# Patient Record
Sex: Female | Born: 1955 | Race: White | Hispanic: No | Marital: Married | Smoking: Never smoker
Health system: Southern US, Community
[De-identification: ages and names within clinical notes are randomized; demographics above are authoritative.]

## PROBLEM LIST (undated history)

## (undated) DIAGNOSIS — N3010 Interstitial cystitis (chronic) without hematuria: Secondary | ICD-10-CM

## (undated) DIAGNOSIS — E039 Hypothyroidism, unspecified: Secondary | ICD-10-CM

## (undated) DIAGNOSIS — K5909 Other constipation: Secondary | ICD-10-CM

## (undated) DIAGNOSIS — K635 Polyp of colon: Secondary | ICD-10-CM

## (undated) HISTORY — DX: Interstitial cystitis (chronic) without hematuria: N30.10

## (undated) HISTORY — DX: Other constipation: K59.09

## (undated) HISTORY — PX: LASIK: SHX215

## (undated) HISTORY — PX: BREAST SURGERY: SHX581

## (undated) HISTORY — DX: Polyp of colon: K63.5

## (undated) HISTORY — DX: Hypothyroidism, unspecified: E03.9

## (undated) HISTORY — PX: BREAST CYST EXCISION: SHX579

## (undated) HISTORY — PX: EYE SURGERY: SHX253

## (undated) HISTORY — PX: BLADDER INSTILLATION: SHX6893

---

## 1984-11-21 HISTORY — PX: INGUINAL HERNIA REPAIR: SHX194

## 2011-11-22 HISTORY — PX: CATARACT EXTRACTION: SUR2

## 2017-11-24 LAB — HM COLONOSCOPY

## 2019-01-18 LAB — HM MAMMOGRAPHY

## 2019-08-07 LAB — HEPATIC FUNCTION PANEL
ALT: 18 (ref 7–35)
AST: 18 (ref 13–35)
Alkaline Phosphatase: 42 (ref 25–125)
Bilirubin, Total: 0.5

## 2019-08-07 LAB — COMPREHENSIVE METABOLIC PANEL
Albumin: 4.6 (ref 3.5–5.0)
Calcium: 9.6 (ref 8.7–10.7)
GFR calc Af Amer: >60
GFR calc non Af Amer: >60

## 2019-08-07 LAB — LIPID PANEL
Cholesterol: 176 (ref 0–200)
HDL: 80 — AB (ref 35–70)
LDL Cholesterol: 82
Triglycerides: 68 (ref 40–160)

## 2019-08-07 LAB — CBC: RBC: 4.62 (ref 3.87–5.11)

## 2019-08-07 LAB — BASIC METABOLIC PANEL
BUN: 11 (ref 4–21)
CO2: 31 — AB (ref 13–22)
Chloride: 104 (ref 99–108)
Creatinine: 0.9 (ref <=1.1)
Glucose: 83
Potassium: 4.2 (ref 3.4–5.3)
Sodium: 143 (ref 137–147)

## 2019-08-07 LAB — CBC AND DIFFERENTIAL
HCT: 43 (ref 36–46)
Hemoglobin: 13.5 (ref 12.0–16.0)
Platelets: 221 (ref 150–399)
WBC: 6.4

## 2019-08-07 LAB — TSH: TSH: 0.48 (ref <=5.90)

## 2019-09-18 ENCOUNTER — Other Ambulatory Visit: Payer: Self-pay | Admitting: Obstetrics and Gynecology

## 2019-09-18 DIAGNOSIS — Z1231 Encounter for screening mammogram for malignant neoplasm of breast: Secondary | ICD-10-CM

## 2019-09-19 ENCOUNTER — Other Ambulatory Visit: Payer: Self-pay | Admitting: Obstetrics and Gynecology

## 2019-09-23 ENCOUNTER — Other Ambulatory Visit: Payer: Self-pay | Admitting: Obstetrics and Gynecology

## 2019-09-23 DIAGNOSIS — E2839 Other primary ovarian failure: Secondary | ICD-10-CM

## 2019-12-10 ENCOUNTER — Ambulatory Visit
Admission: RE | Admit: 2019-12-10 | Discharge: 2019-12-10 | Disposition: A | Discharge status: Home / Self Care | Payer: PRIVATE HEALTH INSURANCE | Source: Ambulatory Visit | Attending: Obstetrics and Gynecology | Admitting: Obstetrics and Gynecology

## 2019-12-10 ENCOUNTER — Other Ambulatory Visit: Payer: Self-pay

## 2019-12-10 DIAGNOSIS — E2839 Other primary ovarian failure: Secondary | ICD-10-CM

## 2019-12-25 DIAGNOSIS — M21619 Bunion of unspecified foot: Secondary | ICD-10-CM | POA: Insufficient documentation

## 2020-01-20 ENCOUNTER — Ambulatory Visit: Payer: Self-pay

## 2020-01-27 ENCOUNTER — Other Ambulatory Visit: Payer: Self-pay

## 2020-01-29 ENCOUNTER — Ambulatory Visit (INDEPENDENT_AMBULATORY_CARE_PROVIDER_SITE_OTHER): Payer: BC Managed Care – PPO | Admitting: Internal Medicine

## 2020-01-29 ENCOUNTER — Encounter: Payer: Self-pay | Admitting: Internal Medicine

## 2020-01-29 ENCOUNTER — Other Ambulatory Visit: Payer: Self-pay

## 2020-01-29 VITALS — BP 120/70 | HR 89 | Ht 64.76 in | Wt 112.0 lb

## 2020-01-29 DIAGNOSIS — E673 Hypervitaminosis D: Secondary | ICD-10-CM

## 2020-01-29 DIAGNOSIS — M810 Age-related osteoporosis without current pathological fracture: Secondary | ICD-10-CM

## 2020-01-29 LAB — VITAMIN D 25 HYDROXY (VIT D DEFICIENCY, FRACTURES): VITD: 104.7 ng/mL (ref 30.00–100.00)

## 2020-01-29 LAB — BASIC METABOLIC PANEL
BUN: 17 mg/dL (ref 6–23)
CO2: 30 mEq/L (ref 19–32)
Calcium: 9.8 mg/dL (ref 8.4–10.5)
Chloride: 104 mEq/L (ref 96–112)
Creatinine, Ser: 0.85 mg/dL (ref 0.40–1.20)
GFR: 67.87 mL/min (ref >60.00)
Glucose, Bld: 86 mg/dL (ref 70–99)
Potassium: 3.8 mEq/L (ref 3.5–5.1)
Sodium: 140 mEq/L (ref 135–145)

## 2020-01-29 NOTE — Patient Instructions (Addendum)
Please stop at the lab.  Please let me know in 11/2020 to order a new bone density scan.  Please come back for a follow-up appointment in 12/2020.  How Can I Prevent Falls? Men and women with osteoporosis need to take care not to fall down. Falls can break bones. Some reasons people fall are: Poor vision  Poor balance  Certain diseases that affect how you walk  Some types of medicine, such as sleeping pills.  Some tips to help prevent falls outdoors are: Use a cane or walker  Wear rubber-soled shoes so you don't slip  Walk on grass when sidewalks are slippery  In winter, put salt or kitty litter on icy sidewalks.  Some ways to help prevent falls indoors are: Keep rooms free of clutter, especially on floors  Use plastic or carpet runners on slippery floors  Wear low-heeled shoes that provide good support  Do not walk in socks, stockings, or slippers  Be sure carpets and area rugs have skid-proof backs or are tacked to the floor  Be sure stairs are well lit and have rails on both sides  Put grab bars on bathroom walls near tub, shower, and toilet  Use a rubber bath mat in the shower or tub  Keep a flashlight next to your bed  Use a sturdy step stool with a handrail and wide steps  Add more lights in rooms (and night lights) Buy a cordless phone to keep with you so that you don't have to rush to the phone       when it rings and so that you can call for help if you fall.   (adapted from http://www.niams.NightlifePreviews.se)   Exercise for Strong Bones (from [employer]) There are two types of exercises that are important for building and maintaining bone density:  weight-bearing and muscle-strengthening exercises. Weight-bearing Exercises These exercises include activities that make you move against gravity while staying upright. Weight-bearing exercises can be high-impact or low-impact. High-impact weight-bearing exercises  help build bones and keep them strong. If you have broken a bone due to osteoporosis or are at risk of breaking a bone, you may need to avoid high-impact exercises. If you're not sure, you should check with your healthcare provider. Examples of high-impact weight-bearing exercises are: . Dancing . Doing high-impact aerobics . Hiking . Jogging/running . Jumping Rope . Stair climbing . Tennis Low-impact weight-bearing exercises can also help keep bones strong and are a safe alternative if you cannot do high-impact exercises. Examples of low-impact weight-bearing exercises are: . Using elliptical training machines . Doing low-impact aerobics . Using stair-step machines . Fast walking on a treadmill or outside Muscle-Strengthening Exercises These exercises include activities where you move your body, a weight or some other resistance against gravity. They are also known as resistance exercises and include: . Lifting weights . Using elastic exercise bands . Using weight machines . Lifting your own body weight . Functional movements, such as standing and rising up on your toes Yoga and Pilates can also improve strength, balance and flexibility. However, certain positions may not be safe for people with osteoporosis or those at increased risk of broken bones. For example, exercises that have you bend forward may increase the chance of breaking a bone in the spine. A physical therapist should be able to help you learn which exercises are safe and appropriate for you. Non-Impact Exercises Non-impact exercises can help you to improve balance, posture and how well you move in everyday activities. These  exercises can also help to increase muscle strength and decrease the risk of falls and broken bones. Some of these exercises include: . Balance exercises that strengthen your legs and test your balance, such as Tai Chi, can decrease your risk of falls. . Posture exercises that improve your posture and  reduce rounded or "sloping" shoulders can help you decrease the chance of breaking a bone, especially in the spine. . Functional exercises that improve how well you move can help you with everyday activities and decrease your chance of falling and breaking a bone. For example, if you have trouble getting up from a chair or climbing stairs, you should do these activities as exercises. A physical therapist can teach you balance, posture and functional exercises. Starting a New Exercise Program If you haven't exercised regularly for a while, check with your healthcare provider before beginning a new exercise program--particularly if you have health problems such as heart disease, diabetes or high blood pressure. If you're at high risk of breaking a bone, you should work with a physical therapist to develop a safe exercise program. Once you have your healthcare provider's approval, start slowly. If you've already broken bones in the spine because of osteoporosis, be very careful to avoid activities that require reaching down, bending forward, rapid twisting motions, heavy lifting and those that increase your chance of a fall. As you get started, your muscles may feel sore for a day or two after you exercise. If soreness lasts longer, you may be working too hard and need to ease up. Exercises should be done in a pain-free range of motion. How Much Exercise Do You Need? Weight-bearing exercises 30 minutes on most days of the week. Do a 30-minutesession or multiple sessions spread out throughout the day. The benefits to your bones are the same.   Muscle-strengthening exercises Two to three days per week. If you don't have much time for strengthening/resistance training, do small amounts at a time. You can do just one body part each day. For example do arms one day, legs the next and trunk the next. You can also spread these exercises out during your normal day.  Balance, posture and functional exercises Every day  or as often as needed. You may want to focus on one area more than the others. If you have fallen or lose your balance, spend time doing balance exercises. If you are getting rounded shoulders, work more on posture exercises. If you have trouble climbing stairs or getting up from the couch, do more functional exercises. You can also perform these exercises at one time or spread them during your day. Work with a phyiscal therapist to learn the right exercises for you.

## 2020-01-29 NOTE — Progress Notes (Signed)
Patient ID: Sophia [name], female   DOB: [birth year], 64 y.o.   MRN: [id number]   This visit occurred during the SARS-CoV-2 public health emergency.  Safety protocols were in place, including screening questions prior to the visit, additional usage of staff PPE, and extensive cleaning of exam room while observing appropriate contact time as indicated for disinfecting solutions.   HPI  Sophia [name] is a 64 y.o.-year-old female, referred by Dr. Marvel Plan, for management of osteoporosis (OP). She lived in [place] and then in [place] in 07/2019.  She will establish care with [facility] in 02/2020.  Pt was dx with OP in 11/2016.  I reviewed pt's lates DXA scans (per records in Epic and also records brought by patient): Date L1-L3 (L4) T score FN T score 33% distal Radius  12/10/2019 (Breast center) -2.5 RFN: -1.6 LFN: -1.5 n/a  10/10/2018 -2.2 RFN: -1.5 LFN: -1.7 n/a  10/09/2017 -2.6 RFN: -1.5 LFN:-1.8 n/a   She denies fractures. She did have 3 falls since moving to [place] (tripping), but she attributes this to a particular pair of shoes.  No dizziness/vertigo/orthostasis/poor vision.   Previous OP treatments:  - Actonel - for 5 years, until 2018 - Reclast 10/2017, 10/2018  No h/o vitamin D deficiency. No recent vit D levels: 07/2019: vitamin D 89 No results found for: VD25OH  Pt is on: - vitamin D 2000 IU + MVI - vitamin K2  She drinks almond milk + cheese + yoghurt.  She does weight bearing exercises. She also runs 2 mi a day and walks 3 more.  She does not take high vitamin A doses.  Menopause was at 64 y/o.  No HRT.  FH of osteoporosis: mother.  No h/o hyper/hypocalcemia or hyperparathyroidism and is on LT4 50 mcg daily - since age 51. No h/o kidney stones. 07/2019: Ca 9.6  No results found for: PTH, CALCIUM   No h/o thyrotoxicosis.  She has a history of hypothyroidism.  Reviewed TSH recent levels:  07/2019: TSH 0.48 No results found for: TSH   No  h/o CKD. Last BUN/Cr: 07/2019: BUN/Cr: 11/0.88, GFR >60 No results found for: BUN, CREATININE   Diet: - Coffee with creamer - Breakfast: Oatmeal with almonds, flax/Chia seeds or coconut yogurt - Lunch: Skips - Midafternoon snack: Nuts, cheese, popcorn - Dinner: Patient/poultry with vegetables/greens, smoothie with almond milk  She takes several supplements including: Immunity support, Zicam chewable, turmeric, biotin 10,000 mcg daily, methyl B12 and folate, probiotic, vitamin K2 + D3 2400-3400 units, niacinamide, magnesium, vitamin C.  ROS: Constitutional: no weight gain, no weight loss, no fatigue, no subjective hyperthermia, no subjective hypothermia, + increased urination and nocturia Eyes: no blurry vision, no xerophthalmia ENT: no sore throat, no nodules palpated in neck, no dysphagia, no odynophagia, no hoarseness, no tinnitus, no hypoacusis Cardiovascular: no CP, no SOB, no palpitations, no leg swelling Respiratory: no cough, no SOB, no wheezing Gastrointestinal: no N, no V, no D, no C, no acid reflux Musculoskeletal: no muscle, no joint aches Skin: no rash, no hair loss Neurological: no tremors, no numbness or tingling/no dizziness/no HAs Psychiatric: no depression, no anxiety  I reviewed pt's medications, allergies, PMH, social hx, family hx, and changes were documented in the history of present illness. Otherwise, unchanged from my initial visit note.  PMH: -OP -Interstitial cystitis  PSxH: -Bladder injection-Botox 2007, 2012, 2018 -Cataract surgery 2013 -Vitrectomy 2012 -Laser-assisted in situ keratomileusis 2000 -Excision of cyst in the right breast-1985, left breast-1999 -Repair of inguinal hernia 1996  Social History   Socioeconomic History  . Marital status: Married    Spouse name: Not on file  . Number of children: 3  . Years of education: Not on file  . Highest education level: Not on file  Occupational History  .  Homemaker/community volunteer   Tobacco Use  . Smoking status: Not on file  Substance and Sexual Activity  . Alcohol use:  1 glass of wine once a week   Social Determinants of Health   Financial Resource Strain:   . Difficulty of Paying Living Expenses: Not on file  Food Insecurity:   . Worried About Charity fundraiser in the Last Year: Not on file  . Ran Out of Food in the Last Year: Not on file  Transportation Needs:   . Lack of Transportation (Medical): Not on file  . Lack of Transportation (Non-Medical): Not on file  Physical Activity:   . Days of Exercise per Week: Not on file  . Minutes of Exercise per Session: Not on file  Stress:   . Feeling of Stress : Not on file  Social Connections:   . Frequency of Communication with Friends and Family: Not on file  . Frequency of Social Gatherings with Friends and Family: Not on file  . Attends Religious Services: Not on file  . Active Member of Clubs or Organizations: Not on file  . Attends Archivist Meetings: Not on file  . Marital Status: Not on file  Intimate Partner Violence:   . Fear of Current or Ex-Partner: Not on file  . Emotionally Abused: Not on file  . Physically Abused: Not on file  . Sexually Abused: Not on file   Current Outpatient Medications  Medication Sig Dispense Refill  . levothyroxine (SYNTHROID) 50 MCG tablet 50 mcg.     No current facility-administered medications for this visit.  + Supplements: See HPI  Allergies  Allergen Reactions  . Sulfa Antibiotics Hives   FH: - Sister, M and F with HTN - MGM with uterine CA  PE: BP 120/70   Pulse 89   Ht 5' 4.76" (1.645 m) Comment: measured today without shoes  Wt 112 lb (50.8 kg)   SpO2 96%   BMI 18.77 kg/m  Wt Readings from Last 3 Encounters:  01/29/20 112 lb (50.8 kg)   Constitutional: Normal weight, in NAD. No kyphosis. Eyes: PERRLA, EOMI, no exophthalmos ENT: moist mucous membranes, no thyromegaly, no cervical lymphadenopathy Cardiovascular: RRR, No  MRG Respiratory: CTA B Gastrointestinal: abdomen soft, NT, ND, BS+ Musculoskeletal: no deformities, strength intact in all 4 Skin: moist, warm, no rashes Neurological: no tremor with outstretched hands, DTR normal in all 4  Assessment: 1. Osteoporosis  Plan: 1. Osteoporosis - likely postmenopausal/age-related, she has FH of OP - Discussed about increased risk of fracture, depending on the T score, greatly increased when the T score is lower than -2.5, but it is actually a continuum and -2.5 should not be regarded as an absolute threshold. We reviewed her last 3 DXA scan reports together, and I explained that based on the T scores, she has an increased risk for fractures.  However, the last report was obtained in [place], on a different DXA machine and it is not directly comparable to the previous 2 ones.  However, the T scores were stable at the level of the femoral necks and slightly lower, in the osteoporotic range at the spine.  On the last report, L4 vertebra had to be excluded from analysis  due to degenerative changes.  This may have been read the results.  For the above reasons, I would be reticent to draw conclusions about the change in BMD from 2019 to 2021. -She has been on a po bisphosphonate between 2013 and 2018 and then she had 2 years of Reclast.  We discussed about the different medication classes, benefits and side effects (including atypical fractures and ONJ - no dental workup in progress or planned).  For now, I suggested to continue without Reclast for another year until we get a new bone density scan and then decide whether we need to start back on medication or not.  At that time, if we do need to start medication, I suggested to consider sq denosumab (Prolia) sq every 6 months for 6-10 years or zoledronic acid (Reclast) iv 1x a year for 1-2 years. I would use Teriparatide/Abaloparatide (which are daily subcu medication) or Romosozumab (monthly) as a last resort.  - we  reviewed her dietary and supplemental calcium and vitamin D intake.  She appears to get 1000-1200 mg of calcium daily from diet and I will check vit D today to see if she needs further supplementation.  She is getting more than 2000 units and supplements. - discussed fall precautions   - given handout from [facility]: weight bearing exercises - advised to do this every day or at least 5/7 days - I also recommended the [facility] - for skeletal loading. Given brochure and explained benefits. - we discussed about maintaining a good amount of protein in her diet. The recommended daily protein intake is ~0.8 g per kilogram per day. I advised her to try to aim for this amount, since a diet low in proteins can exacerbate osteoporosis. Also, avoid smoking or >2 drinks of alcohol a day. - I recommended an alkaline diet and explained how to render her diet more alkaline - Will check the following labs today: Orders Placed This Encounter  Procedures  . Basic metabolic panel  . VITAMIN D 25 Hydroxy (Vit-D Deficiency, Fractures)  - will check a new DXA scan in 1 year, on the same bone densitometer: unchanged or slightly higher T-scores are desirable - will see pt back in 11 months  - time spent for this appointment: 1 hour - in obtaining information about her osteoporosis, reviewing her previous records both available in the chart and brought by patient including labs, evaluations, and treatments, counseling her about her condition (please see the discussed topics above), and developing a plan to further investigate and treat it; she had a number of questions which I addressed.  Office Visit on 01/29/2020  Component Date Value Ref Range Status  . Sodium 01/29/2020 140  135 - 145 mEq/L Final  . Potassium 01/29/2020 3.8  3.5 - 5.1 mEq/L Final  . Chloride 01/29/2020 104  96 - 112 mEq/L Final  . CO2 01/29/2020 30  19 - 32 mEq/L Final  . Glucose, Bld 01/29/2020 86  70 - 99 mg/dL  Final  . BUN 01/29/2020 17  6 - 23 mg/dL Final  . Creatinine, Ser 01/29/2020 0.85  0.40 - 1.20 mg/dL Final  . GFR 01/29/2020 67.87  >60.00 mL/min Final  . Calcium 01/29/2020 9.8  8.4 - 10.5 mg/dL Final  . VITD 01/29/2020 104.70* 30.00 - 100.00 ng/mL Final   Vitamin D is too high.  I would advise her to stop taking the 2000 units for the next 5 days and then start only 1000 units.  In 1.5 to 2 months, I will have her back for repeat level. Philemon Kingdom, MD PhD [facility] Endocrinology

## 2020-01-30 ENCOUNTER — Encounter: Payer: Self-pay | Admitting: Internal Medicine

## 2020-02-24 ENCOUNTER — Ambulatory Visit: Payer: Self-pay

## 2020-02-27 ENCOUNTER — Other Ambulatory Visit: Payer: Self-pay

## 2020-02-28 ENCOUNTER — Ambulatory Visit (INDEPENDENT_AMBULATORY_CARE_PROVIDER_SITE_OTHER): Payer: BC Managed Care – PPO | Admitting: Family Medicine

## 2020-02-28 ENCOUNTER — Encounter: Payer: Self-pay | Admitting: Family Medicine

## 2020-02-28 VITALS — BP 102/60 | HR 82 | Temp 97.7°F | Ht 64.76 in | Wt 110.9 lb

## 2020-02-28 DIAGNOSIS — M810 Age-related osteoporosis without current pathological fracture: Secondary | ICD-10-CM | POA: Diagnosis not present

## 2020-02-28 DIAGNOSIS — E039 Hypothyroidism, unspecified: Secondary | ICD-10-CM | POA: Diagnosis not present

## 2020-02-28 DIAGNOSIS — N3010 Interstitial cystitis (chronic) without hematuria: Secondary | ICD-10-CM | POA: Diagnosis not present

## 2020-02-28 DIAGNOSIS — C4491 Basal cell carcinoma of skin, unspecified: Secondary | ICD-10-CM

## 2020-02-28 LAB — TSH: TSH: 1.92 u[IU]/mL (ref 0.35–4.50)

## 2020-02-28 MED ORDER — LEVOTHYROXINE SODIUM 50 MCG PO TABS: 50.0000 ug | ORAL_TABLET | Freq: Every day | ORAL | 1 refills | Status: DC

## 2020-02-28 NOTE — Patient Instructions (Signed)
[email or web address] ([email or web address] is her website)

## 2020-02-28 NOTE — Progress Notes (Signed)
Sophia [name] DOB: [birth year] Encounter date: 02/28/2020  This isa 64 y.o. female who presents to establish care. Chief Complaint  Patient presents with  . Establish Care    History of present illness:  Osteoporosis: following with Dr. Renne Crigler; has been on actonel and reclast in past; and is decreasing vitamin D (she was over-supplementing) for now with plans for repeat DEXA in a year.  Sees Dr. Marvel Plan for gyn needs  Left knee occasionally hurts - was walking dog when she initially did it; just turned wrong way. Does run daily. Knee doesn't bother her while running. Did have left hip bursitis which bothered her in past.   Had yearly mammogram in this last month.   Just made it through norovirus. But feeling much better now. Has to take miralax for bowel movements.   Had colonoscopy last in 11/2017; had polyp. Suggested repeat in 5 years; but would like to stick to 3 years.   Has always had small bladder/frequency. After having 3 kids she got tired of symptoms. Has had 79 urologists between all the moves in the last 20 years; and 3 botox injections (which didn't help much except for retention which wasn't always helpful). Doesn't have a lot of pain; more issue with frequency. Interrupts sleep - wakes every 2 hours.   Has completed COVID vaccinations.   [facility] - had a little basal cell and is following yearly with them now.    Past Medical History:  Diagnosis Date  . Hypothyroidism    Past Surgical History:  Procedure Laterality Date  . BLADDER INSTILLATION     2007,2012,2018 botox  . BREAST SURGERY     1985-right, mid 1990s-left benign (fibrocystic breast disease)  . CATARACT EXTRACTION Left 2013  . EYE SURGERY Left    done for floaters  . INGUINAL HERNIA REPAIR Left 1986  . LASIK Bilateral    2000   Allergies  Allergen Reactions  . Sulfa Antibiotics Hives   Current Meds  Medication Sig  . Ascorbic Acid (VITAMIN C PO) Take by mouth as needed.  .  Cholecalciferol (VITAMIN D3 PO) Take 2,000 Units by mouth daily.  . Homeopathic Products [facility] COLD REMEDY NA) by Other route in the morning and at bedtime.  Marland Kitchen levothyroxine (SYNTHROID) 50 MCG tablet Take 1 tablet (50 mcg total) by mouth daily before breakfast.  . MAGNESIUM PO Take 200 mg by mouth daily.  . Menaquinone-7 (VITAMIN K2) 100 MCG CAPS Take by mouth. daily  . NIACINAMIDE PO Take 1,000 mg by mouth daily.  Marland Kitchen OVER THE COUNTER MEDICATION Immunity support 1000mg  daily  . OVER THE COUNTER MEDICATION Turmeric/curcumin 1000mg  daily  . OVER THE COUNTER MEDICATION Methyl B-12 1025mcg and Methyl Folate(plus P-5-P/B-6) 470mcg once a day  . Probiotic Product (PROBIOTIC DAILY PO) Take by mouth.  . [DISCONTINUED] levothyroxine (SYNTHROID) 50 MCG tablet 50 mcg.   Social History   Tobacco Use  . Smoking status: Never Smoker  . Smokeless tobacco: Never Used  Substance Use Topics  . Alcohol use: Yes    Comment: occasionally   Family History  Problem Relation Age of Onset  . Arthritis Mother   . Hypertension Mother   . Hypertension Father   . Prostate cancer Father 22  . Basal cell carcinoma Father   . Hypertension Sister   . Diabetes Mellitus II Maternal Grandmother   . Uterine cancer Maternal Grandmother        ? not certain     Review of Systems  Constitutional: Negative for chills, fatigue and fever.  Respiratory: Negative for cough, chest tightness, shortness of breath and wheezing.   Cardiovascular: Negative for chest pain, palpitations and leg swelling.  Musculoskeletal:       Left trochanteric bursitis flares on and off    Objective:  BP 102/60 (BP Location: Left Arm, Patient Position: Sitting, Cuff Size: Normal)   Pulse 82   Temp 97.7 F (36.5 C) (Temporal)   Ht 5' 4.76" (1.645 m)   Wt 110 lb 14.4 oz (50.3 kg)   BMI 18.59 kg/m   Weight: 110 lb 14.4 oz (50.3 kg)   BP Readings from Last 3 Encounters:  02/28/20 102/60  01/29/20 120/70   Wt Readings from Last 3  Encounters:  02/28/20 110 lb 14.4 oz (50.3 kg)  01/29/20 112 lb (50.8 kg)    Physical Exam Constitutional:      General: She is not in acute distress.    Appearance: She is well-developed.  Cardiovascular:     Rate and Rhythm: Normal rate and regular rhythm.     Heart sounds: Normal heart sounds. No murmur. No friction rub.  Pulmonary:     Effort: Pulmonary effort is normal. No respiratory distress.     Breath sounds: Normal breath sounds. No wheezing or rales.  Musculoskeletal:     Right lower leg: No edema.     Left lower leg: No edema.  Neurological:     Mental Status: She is alert and oriented to person, place, and time.  Psychiatric:        Behavior: Behavior normal.     Assessment/Plan:  1. Hypothyroidism, unspecified type Will recheck today; we discussed making sure not to overcorrect thyroid due to affects on bone density. She has been well controlled on current dose of synthroid. - TSH; Future - TSH  2. Osteoporosis without current pathological fracture, unspecified osteoporosis type Following with endo for this. Plan to repeat dexa next year.  3. Interstitial cystitis Symptoms are stable. Has seen multiple urologists in past and had extensive eval.  4. Basal cell carcinoma (BCC), unspecified site Following w skin surgery center.  Return in about 6 months (around 08/29/2020) for physical exam.  Micheline Rough, MD

## 2020-03-01 DIAGNOSIS — N3010 Interstitial cystitis (chronic) without hematuria: Secondary | ICD-10-CM | POA: Insufficient documentation

## 2020-03-01 DIAGNOSIS — C4491 Basal cell carcinoma of skin, unspecified: Secondary | ICD-10-CM | POA: Insufficient documentation

## 2020-03-01 DIAGNOSIS — M810 Age-related osteoporosis without current pathological fracture: Secondary | ICD-10-CM | POA: Insufficient documentation

## 2020-03-02 ENCOUNTER — Other Ambulatory Visit: Payer: Self-pay

## 2020-03-02 ENCOUNTER — Ambulatory Visit
Admission: RE | Admit: 2020-03-02 | Discharge: 2020-03-02 | Disposition: A | Discharge status: Home / Self Care | Payer: BC Managed Care – PPO | Source: Ambulatory Visit | Attending: Obstetrics and Gynecology | Admitting: Obstetrics and Gynecology

## 2020-03-02 DIAGNOSIS — Z1231 Encounter for screening mammogram for malignant neoplasm of breast: Secondary | ICD-10-CM

## 2020-03-03 ENCOUNTER — Encounter: Payer: Self-pay | Admitting: Family Medicine

## 2020-03-03 NOTE — Addendum Note (Signed)
Addended by: Agnes Lawrence on: 03/03/2020 02:10 PM   Modules accepted: Orders

## 2020-03-05 ENCOUNTER — Encounter: Payer: Self-pay | Admitting: Family Medicine

## 2020-03-30 ENCOUNTER — Encounter: Payer: Self-pay | Admitting: Internal Medicine

## 2020-03-30 ENCOUNTER — Other Ambulatory Visit: Payer: Self-pay | Admitting: Internal Medicine

## 2020-03-30 ENCOUNTER — Other Ambulatory Visit: Payer: Self-pay

## 2020-03-30 ENCOUNTER — Other Ambulatory Visit (INDEPENDENT_AMBULATORY_CARE_PROVIDER_SITE_OTHER): Payer: BC Managed Care – PPO

## 2020-03-30 DIAGNOSIS — E673 Hypervitaminosis D: Secondary | ICD-10-CM | POA: Diagnosis not present

## 2020-03-30 LAB — VITAMIN D 25 HYDROXY (VIT D DEFICIENCY, FRACTURES): VITD: 100.64 ng/mL — ABNORMAL HIGH (ref 30.00–100.00)

## 2020-03-31 ENCOUNTER — Other Ambulatory Visit: Payer: BC Managed Care – PPO

## 2020-03-31 DIAGNOSIS — E673 Hypervitaminosis D: Secondary | ICD-10-CM

## 2020-04-01 ENCOUNTER — Other Ambulatory Visit: Payer: BC Managed Care – PPO

## 2020-04-01 ENCOUNTER — Other Ambulatory Visit: Payer: Self-pay

## 2020-04-01 LAB — VITAMIN D 25 HYDROXY (VIT D DEFICIENCY, FRACTURES): Vit D, 25-Hydroxy: 80.9 ng/mL (ref 30.0–100.0)

## 2020-04-16 ENCOUNTER — Encounter: Payer: Self-pay | Admitting: Internal Medicine

## 2020-05-22 NOTE — Telephone Encounter (Signed)
Patient called to advise that she is still receiving a bill for lab work and does not feel that it is accurate.  Date of service in question is 03/31/2020.  She has called [facility] billing and the routed her back to office.  Patient has contacted Lab and Insurance with no resolution.  Call back number [phone] - [facility]

## 2020-06-03 ENCOUNTER — Other Ambulatory Visit: Payer: Self-pay

## 2020-06-03 ENCOUNTER — Other Ambulatory Visit: Payer: BC Managed Care – PPO

## 2020-06-03 DIAGNOSIS — E039 Hypothyroidism, unspecified: Secondary | ICD-10-CM

## 2020-06-03 NOTE — Addendum Note (Signed)
Addended by: Marrion Coy on: 06/03/2020 10:48 AM   Modules accepted: Orders

## 2020-06-04 ENCOUNTER — Encounter: Payer: Self-pay | Admitting: Family Medicine

## 2020-06-04 LAB — TSH: TSH: 2.39 mIU/L (ref 0.40–4.50)

## 2020-07-06 ENCOUNTER — Encounter: Payer: Self-pay | Admitting: Family Medicine

## 2020-07-06 MED ORDER — LEVOTHYROXINE SODIUM 25 MCG PO TABS: 25.0000 ug | ORAL_TABLET | Freq: Every day | ORAL | 0 refills | Status: DC

## 2020-07-06 NOTE — Telephone Encounter (Signed)
Rx for Levothyroxine 3mcg sent to [facility] per results notes from 4/11 and pt informed via Mychart message.

## 2020-07-18 ENCOUNTER — Encounter: Payer: Self-pay | Admitting: Family Medicine

## 2020-08-06 ENCOUNTER — Encounter: Payer: Self-pay | Admitting: Internal Medicine

## 2020-08-31 ENCOUNTER — Encounter: Payer: Self-pay | Admitting: Family Medicine

## 2020-08-31 ENCOUNTER — Ambulatory Visit (INDEPENDENT_AMBULATORY_CARE_PROVIDER_SITE_OTHER): Payer: BC Managed Care – PPO | Admitting: Family Medicine

## 2020-08-31 ENCOUNTER — Other Ambulatory Visit: Payer: Self-pay

## 2020-08-31 VITALS — BP 96/58 | HR 80 | Temp 98.3°F | Ht 65.75 in | Wt 112.8 lb

## 2020-08-31 DIAGNOSIS — Z13220 Encounter for screening for lipoid disorders: Secondary | ICD-10-CM

## 2020-08-31 DIAGNOSIS — M810 Age-related osteoporosis without current pathological fracture: Secondary | ICD-10-CM | POA: Diagnosis not present

## 2020-08-31 DIAGNOSIS — N3010 Interstitial cystitis (chronic) without hematuria: Secondary | ICD-10-CM | POA: Diagnosis not present

## 2020-08-31 DIAGNOSIS — C4491 Basal cell carcinoma of skin, unspecified: Secondary | ICD-10-CM

## 2020-08-31 DIAGNOSIS — E039 Hypothyroidism, unspecified: Secondary | ICD-10-CM

## 2020-08-31 DIAGNOSIS — Z0000 Encounter for general adult medical examination without abnormal findings: Secondary | ICD-10-CM | POA: Diagnosis not present

## 2020-08-31 DIAGNOSIS — Z131 Encounter for screening for diabetes mellitus: Secondary | ICD-10-CM

## 2020-08-31 DIAGNOSIS — G4700 Insomnia, unspecified: Secondary | ICD-10-CM

## 2020-08-31 DIAGNOSIS — D126 Benign neoplasm of colon, unspecified: Secondary | ICD-10-CM

## 2020-08-31 MED ORDER — ESCITALOPRAM OXALATE 5 MG PO TABS: 5.0000 mg | ORAL_TABLET | Freq: Every day | ORAL | 1 refills | Status: DC

## 2020-08-31 NOTE — Progress Notes (Addendum)
Sophia [name] DOB: 1956-03-01 Encounter date: 08/31/2020  This is a 64 y.o. female who presents for complete physical   History of present illness/Additional concerns: Not sleeping well. Last night woke at 2:30 and couldn't get back to sleep until 5. Gets some evening anxiety and starts thinking about things. Had nightmare on mirtazepine. Didn't do well with the trazodone. Interested in trying lexapro.   Had COVID booster and   Osteoporosis: following with Dr. Renne Crigler; has been on actonel and reclast in past; and is decreasing vitamin D.  Bone density was 12/10/2019.  Sees Dr. Marvel Plan for gyn needs.  Mammogram completed 02/2020: Normal  Had colonoscopy last in 11/2017; had polyp. Suggested repeat in 5 years; but would like to stick to 3 years.   Urinary frequency, chronic: still waking her as well; but not worse. Usually able to fall asleep after urinating.   [facility] surgery center - had a little basal cell and is following yearly or twice yearly with them.   Past Medical History:  Diagnosis Date  . Hypothyroidism    Past Surgical History:  Procedure Laterality Date  . BLADDER INSTILLATION     2007,2012,2018 botox  . BREAST CYST EXCISION Bilateral 1985, 1995   both benign, 1 on each breast  . BREAST SURGERY     1985-right, mid 1990s-left benign (fibrocystic breast disease)  . CATARACT EXTRACTION Left 2013  . EYE SURGERY Left    done for floaters  . INGUINAL HERNIA REPAIR Left 1986  . LASIK Bilateral    2000   Allergies  Allergen Reactions  . Sulfa Antibiotics Hives   Current Meds  Medication Sig  . Ascorbic Acid (VITAMIN C PO) Take by mouth as needed.  . Cholecalciferol (VITAMIN D3 PO) Take 2,000 Units by mouth daily.  . Homeopathic Products [facility] COLD REMEDY NA) by Other route in the morning and at bedtime.  Marland Kitchen levothyroxine (SYNTHROID) 25 MCG tablet Take 1 tablet (25 mcg total) by mouth daily before breakfast.  . MAGNESIUM PO Take 200 mg by mouth daily.  .  Menaquinone-7 (VITAMIN K2) 100 MCG CAPS Take by mouth. daily  . NIACINAMIDE PO Take 1,000 mg by mouth daily.  Marland Kitchen OVER THE COUNTER MEDICATION Immunity support 1000mg  daily  . OVER THE COUNTER MEDICATION Turmeric/curcumin 1000mg  daily  . OVER THE COUNTER MEDICATION Methyl B-12 1017mcg and Methyl Folate(plus P-5-P/B-6) 473mcg once a day  . Probiotic Product (PROBIOTIC DAILY PO) Take by mouth.   Social History   Tobacco Use  . Smoking status: Never Smoker  . Smokeless tobacco: Never Used  Substance Use Topics  . Alcohol use: Yes    Comment: occasionally   Family History  Problem Relation Age of Onset  . Arthritis Mother   . Hypertension Mother   . Hypertension Father   . Prostate cancer Father 39  . Basal cell carcinoma Father   . Hypertension Sister   . Diabetes Mellitus II Maternal Grandmother   . Uterine cancer Maternal Grandmother        ? not certain     Review of Systems  Constitutional: Negative for activity change, appetite change, chills, fatigue, fever and unexpected weight change.  HENT: Negative for congestion, ear pain, hearing loss, sinus pressure, sinus pain, sore throat and trouble swallowing.   Eyes: Negative for pain and visual disturbance.  Respiratory: Negative for cough, chest tightness, shortness of breath and wheezing.   Cardiovascular: Negative for chest pain, palpitations and leg swelling.  Gastrointestinal: Negative for abdominal pain, blood in  stool, constipation, diarrhea, nausea and vomiting.  Genitourinary: Negative for difficulty urinating and menstrual problem.  Musculoskeletal: Negative for arthralgias and back pain.  Skin: Negative for rash.  Neurological: Negative for dizziness, weakness, numbness and headaches.  Hematological: Negative for adenopathy. Does not bruise/bleed easily.  Psychiatric/Behavioral: Negative for sleep disturbance and suicidal ideas. The patient is not nervous/anxious.     CBC:  Lab Results  Component Value Date    WBC 6.4 08/07/2019   HGB 13.5 08/07/2019   HCT 43 08/07/2019   PLT 221 08/07/2019   CMP: Lab Results  Component Value Date   NA 140 01/29/2020   NA 143 08/07/2019   K 3.8 01/29/2020   CL 104 01/29/2020   CO2 30 01/29/2020   GLUCOSE 86 01/29/2020   BUN 17 01/29/2020   BUN 11 08/07/2019   CREATININE 0.85 01/29/2020   GFRAA >60 08/07/2019   CALCIUM 9.8 01/29/2020   ALKPHOS 42 08/07/2019   ALT 18 08/07/2019   AST 18 08/07/2019   LIPID: Lab Results  Component Value Date   CHOL 176 08/07/2019   TRIG 68 08/07/2019   HDL 80 (A) 08/07/2019   LDLCALC 82 08/07/2019    Objective:  BP (!) 96/58 (BP Location: Left Arm, Patient Position: Sitting, Cuff Size: Normal)   Pulse 80   Temp 98.3 F (36.8 C) (Oral)   Ht 5' 5.75" (1.67 m)   Wt 112 lb 12.8 oz (51.2 kg)   BMI 18.35 kg/m   Weight: 112 lb 12.8 oz (51.2 kg)   BP Readings from Last 3 Encounters:  08/31/20 (!) 96/58  02/28/20 102/60  01/29/20 120/70   Wt Readings from Last 3 Encounters:  08/31/20 112 lb 12.8 oz (51.2 kg)  02/28/20 110 lb 14.4 oz (50.3 kg)  01/29/20 112 lb (50.8 kg)    Physical Exam Constitutional:      General: She is not in acute distress.    Appearance: She is well-developed.  HENT:     Head: Normocephalic and atraumatic.     Right Ear: External ear normal.     Left Ear: External ear normal.     Mouth/Throat:     Pharynx: No oropharyngeal exudate.  Eyes:     Conjunctiva/sclera: Conjunctivae normal.     Pupils: Pupils are equal, round, and reactive to light.  Neck:     Thyroid: No thyromegaly.  Cardiovascular:     Rate and Rhythm: Normal rate and regular rhythm.     Heart sounds: Normal heart sounds. No murmur heard.  No friction rub. No gallop.   Pulmonary:     Effort: Pulmonary effort is normal.     Breath sounds: Normal breath sounds.  Abdominal:     General: Bowel sounds are normal. There is no distension.     Palpations: Abdomen is soft. There is no mass.     Tenderness: There is  no abdominal tenderness. There is no guarding.     Hernia: No hernia is present.  Musculoskeletal:        General: No tenderness or deformity. Normal range of motion.     Cervical back: Normal range of motion and neck supple.  Lymphadenopathy:     Cervical: No cervical adenopathy.  Skin:    General: Skin is warm and dry.     Findings: No rash.  Neurological:     Mental Status: She is alert and oriented to person, place, and time.     Deep Tendon Reflexes: Reflexes normal.  Reflex Scores:      Tricep reflexes are 2+ on the right side and 2+ on the left side.      Bicep reflexes are 2+ on the right side and 2+ on the left side.      Brachioradialis reflexes are 2+ on the right side and 2+ on the left side.      Patellar reflexes are 2+ on the right side and 2+ on the left side. Psychiatric:        Speech: Speech normal.        Behavior: Behavior normal.        Thought Content: Thought content normal.     Assessment/Plan: There are no preventive care reminders to display for this patient. Health Maintenance reviewed.  1. Preventative health care Keep up with regular exercise and healthy eating.   2. Osteoporosis without current pathological fracture, unspecified osteoporosis type Following with endo for this. Working on [employer] bearing exercise.  - VITAMIN D 25 Hydroxy (Vit-D Deficiency, Fractures); Future  3. Basal cell carcinoma (BCC), unspecified site Following with skin specialist; always wearing sun protection.   4. Interstitial cystitis - CBC with Differential/Platelet; Future  5. Hypothyroidism, unspecified type Continue with synthroid; will recheck for stability.  - TSH; Future  6. Tubular adenoma of colon - Ambulatory referral to Gastroenterology  7. Lipid screening - Lipid panel; Future  8. Screening for diabetes mellitus - Comprehensive metabolic panel; Future  9. Insomnia: we are going to try lexapro; trying for evening anxiety.   Return in about 6  months (around 03/01/2021) for Chronic condition visit.  Micheline Rough, MD

## 2020-09-01 LAB — CBC WITH DIFFERENTIAL/PLATELET
Absolute Monocytes: 419 cells/uL (ref 200–950)
Basophils Absolute: 48 cells/uL (ref 0–200)
Basophils Relative: 0.9 %
Eosinophils Absolute: 260 cells/uL (ref 15–500)
Eosinophils Relative: 4.9 %
HCT: 41.6 % (ref 35.0–45.0)
Hemoglobin: 13.6 g/dL (ref 11.7–15.5)
Lymphs Abs: 1855 cells/uL (ref 850–3900)
MCH: 30 pg (ref 27.0–33.0)
MCHC: 32.7 g/dL (ref 32.0–36.0)
MCV: 91.6 fL (ref 80.0–100.0)
MPV: 11.9 fL (ref 7.5–12.5)
Monocytes Relative: 7.9 %
Neutro Abs: 2719 cells/uL (ref 1500–7800)
Neutrophils Relative %: 51.3 %
Platelets: 218 10*3/uL (ref 140–400)
RBC: 4.54 10*6/uL (ref 3.80–5.10)
RDW: 12.3 % (ref 11.0–15.0)
Total Lymphocyte: 35 %
WBC: 5.3 10*3/uL (ref 3.8–10.8)

## 2020-09-01 LAB — COMPREHENSIVE METABOLIC PANEL
AG Ratio: 1.7 (calc) (ref 1.0–2.5)
ALT: 19 U/L (ref 6–29)
AST: 19 U/L (ref 10–35)
Albumin: 4.5 g/dL (ref 3.6–5.1)
Alkaline phosphatase (APISO): 51 U/L (ref 37–153)
BUN: 14 mg/dL (ref 7–25)
CO2: 31 mmol/L (ref 20–32)
Calcium: 9.8 mg/dL (ref 8.6–10.4)
Chloride: 104 mmol/L (ref 98–110)
Creat: 0.87 mg/dL (ref 0.50–0.99)
Globulin: 2.6 g/dL (calc) (ref 1.9–3.7)
Glucose, Bld: 92 mg/dL (ref 65–99)
Potassium: 4.1 mmol/L (ref 3.5–5.3)
Sodium: 141 mmol/L (ref 135–146)
Total Bilirubin: 0.4 mg/dL (ref 0.2–1.2)
Total Protein: 7.1 g/dL (ref 6.1–8.1)

## 2020-09-01 LAB — LIPID PANEL
Cholesterol: 177 mg/dL (ref <200)
HDL: 72 mg/dL (ref >=50)
LDL Cholesterol (Calc): 92 mg/dL (calc)
Non-HDL Cholesterol (Calc): 105 mg/dL (calc) (ref <130)
Total CHOL/HDL Ratio: 2.5 (calc) (ref <5.0)
Triglycerides: 52 mg/dL (ref <150)

## 2020-09-01 LAB — VITAMIN D 25 HYDROXY (VIT D DEFICIENCY, FRACTURES): Vit D, 25-Hydroxy: 76 ng/mL (ref 30–100)

## 2020-09-01 LAB — TSH: TSH: 2.91 mIU/L (ref 0.40–4.50)

## 2020-09-07 ENCOUNTER — Encounter: Payer: Self-pay | Admitting: Internal Medicine

## 2020-09-08 ENCOUNTER — Other Ambulatory Visit: Payer: Self-pay | Admitting: Internal Medicine

## 2020-09-08 DIAGNOSIS — M810 Age-related osteoporosis without current pathological fracture: Secondary | ICD-10-CM

## 2020-09-08 MED ORDER — ZOLEDRONIC ACID 5 MG/100ML IV SOLN: 5.0000 mg | Freq: Once | INTRAVENOUS | Status: DC

## 2020-09-09 ENCOUNTER — Other Ambulatory Visit: Payer: Self-pay | Admitting: Obstetrics and Gynecology

## 2020-09-09 DIAGNOSIS — Z1231 Encounter for screening mammogram for malignant neoplasm of breast: Secondary | ICD-10-CM

## 2020-09-10 ENCOUNTER — Other Ambulatory Visit: Payer: Self-pay | Admitting: Internal Medicine

## 2020-09-10 DIAGNOSIS — M810 Age-related osteoporosis without current pathological fracture: Secondary | ICD-10-CM

## 2020-09-15 ENCOUNTER — Telehealth: Payer: Self-pay | Admitting: Gastroenterology

## 2020-09-15 ENCOUNTER — Telehealth: Payer: Self-pay

## 2020-09-15 NOTE — Telephone Encounter (Signed)
Started PA for Reclast- got notified by [employer] that the PA was cancelled as it did not require a PA- will send information to short stay to get scheduled for Reclast.

## 2020-09-15 NOTE — Telephone Encounter (Signed)
Reviewed records from [facility] clinic in [place].  Colonoscopy 11/24/2017 with Dr. Rock Nephew revealed 2 small polyps and small internal hemorrhoids.  Pathology results show a tubular adenoma and a hyperplastic polyp.  Surveillance colonoscopy recommended in January 2024.  Prior gastroenterologist recommended colonoscopy in 2024.  Since the time of this procedure are interval for surveillance colonoscopy has changed.  The current guidelines would suggest that she would not need another colonoscopy until 2026.  However, I recommend that we follow Dr. Lala Lund prior recommendation and proceed with colonoscopy 5 years after her last.  Please schedule a colonoscopy in January 2024.  Thank you.

## 2020-09-15 NOTE — Telephone Encounter (Signed)
I will look for those records.  

## 2020-09-15 NOTE — Telephone Encounter (Signed)
Hey Dr Tarri Glenn, this pt is being referred by [facility] for a repeat colonoscopy, pt has last one done in 11/2017, I will send the records to you for review, please advise on scheduling

## 2020-09-16 ENCOUNTER — Encounter: Payer: Self-pay | Admitting: Family Medicine

## 2020-09-17 NOTE — Progress Notes (Signed)
Please let patient know about note; Dr. Tarri Glenn reviewed and suggests repeat colonoscopy 2024. It was patient's desire to complete earlier. She will need to call them to discuss if she still desires this.

## 2020-09-21 ENCOUNTER — Telehealth: Payer: Self-pay | Admitting: *Deleted

## 2020-09-21 NOTE — Telephone Encounter (Signed)
Left a detailed message at the pts cell number with the information below. 

## 2020-09-21 NOTE — Telephone Encounter (Signed)
-----   Message from Caren Macadam, MD sent at 09/17/2020 11:37 PM EDT ----- Regarding: FW: Other   ----- Message ----- From: Waylan Rocher Sent: 09/16/2020   9:30 AM EDT To: Caren Macadam, MD Subject: Other

## 2020-09-22 NOTE — Telephone Encounter (Signed)
I have checked on our supplies.  We have everything except the normal saline.  I have asked that it be ordered.  Once it comes in, I can start her on this, hopefully some time next week.

## 2020-10-05 ENCOUNTER — Telehealth: Payer: Self-pay | Admitting: Nutrition

## 2020-10-05 NOTE — Telephone Encounter (Signed)
Appointment scheduled for tomorrow.

## 2020-10-05 NOTE — Telephone Encounter (Signed)
Appintment scheduled for tomorrow for reclast infusion.

## 2020-10-06 ENCOUNTER — Other Ambulatory Visit: Payer: Self-pay

## 2020-10-06 ENCOUNTER — Ambulatory Visit (INDEPENDENT_AMBULATORY_CARE_PROVIDER_SITE_OTHER): Payer: BC Managed Care – PPO | Admitting: Nutrition

## 2020-10-06 DIAGNOSIS — M818 Other osteoporosis without current pathological fracture: Secondary | ICD-10-CM | POA: Diagnosis not present

## 2020-10-07 NOTE — Patient Instructions (Addendum)
Please drink 4-6 glasses of water today. 

## 2020-10-07 NOTE — Progress Notes (Signed)
Patient was identified by name and DOB.  After viewing Dr. Arman Filter note on 09/07/20 , and the patient signed the consent form, and IV was started in her right forarm.  85ml of Normal saline flush was inserted and the IV was intack.  5 mg. Of Zolendronic acid was started at  3:10PM.   During the procedure, the patient turned her arm and dislodge the needle.  A small area of infiltration occurred, and the IV was d/C and restarted in her left forarm. This IV infused until  3:45.  Pt. Reported no discomfort at the infusion site,or lightheadednes.  The IV was flushed with normal saline and D/Ced at  3:47PM  The site showed no signs of redness or swelling.  She was encouraged to drink 4-6 glasses of water today, and to continue to take her calcium and Vit.D.. She agreed to do this, and had no final questions.

## 2020-10-25 ENCOUNTER — Encounter: Payer: Self-pay | Admitting: Family Medicine

## 2020-10-27 MED ORDER — ESCITALOPRAM OXALATE 10 MG PO TABS: 10.0000 mg | ORAL_TABLET | Freq: Every day | ORAL | 1 refills | Status: DC

## 2020-11-05 ENCOUNTER — Encounter: Payer: Self-pay | Admitting: Internal Medicine

## 2020-11-09 ENCOUNTER — Encounter: Payer: Self-pay | Admitting: Gastroenterology

## 2020-12-23 ENCOUNTER — Ambulatory Visit (INDEPENDENT_AMBULATORY_CARE_PROVIDER_SITE_OTHER): Payer: BC Managed Care – PPO | Admitting: Gastroenterology

## 2020-12-23 ENCOUNTER — Encounter: Payer: Self-pay | Admitting: Gastroenterology

## 2020-12-23 VITALS — BP 110/60 | HR 82 | Ht 65.75 in | Wt 113.0 lb

## 2020-12-23 DIAGNOSIS — Z86010 Personal history of colonic polyps: Secondary | ICD-10-CM | POA: Diagnosis not present

## 2020-12-23 NOTE — Progress Notes (Signed)
Referring Provider: Caren Macadam, MD Primary Care Physician:  Caren Macadam, MD  Reason for Consultation:  History of colon polyps   IMPRESSION:  History of colon polyp - tubular adenoma on colonoscopy 2019 Family history of colon polyps (2 sisters) Altered bowel habits for 10 years  She has high anxiety about missed polyps or the development of new polyps since her last colonoscopy given her family history of polyps and her chronic, baseline altered bowel habits. Prior gastroenterologist recommended colonoscopy in 2024 after her colonoscopy in 2019. I recommend that we follow Dr. Lala Lund prior recommendation and proceed with colonoscopy 5 years after her last, which is a shorter interval than current multi-society task force guidelines. However, she is extremely concerned and would like to proceed with a colonoscopy now, accepting the increased potential risks with endoscopy and anesthesia including reaction to medication, cardiopulmonary compromise, bleeding requiring blood transfusion, perforation requiring surgery, and even death. I was very frank about these risks and my concerns about a bad outcome when not following surveillance guidelines.   PLAN: Colonoscopy if/when she decides to proceed, ideally 2024 or earlier with new symptoms  Please see the "Patient Instructions" section for addition details about the plan.  HPI: Sophia [name] is a 65 y.o. female referred by Dr. Ethlyn Gallery. She has recently moved to [place] from [place] to be closer to her family.  Reviewed records from [facility] in [place].  Colonoscopy 11/24/2017 with Dr. Rock Nephew revealed 2 small polyps and small internal hemorrhoids.  Pathology results show a tubular adenoma and a hyperplastic polyp.  Surveillance colonoscopy recommended in January 2024. However, the patient would prefer to have a 3 year surveillance colonoscopy instead. She has extra concerns given her family history of multiple  sisters with precancerous polyps. She has also had altered bowel habits over the last 10 years.  Prone to constipation  Multiple sisters with precancerous polyps. No known family history of colon cancer or polyps. No family history of uterine/endometrial cancer, pancreatic cancer or gastric/stomach cancer.  Labs 08/31/20 included a normal CBC   Past Medical History:  Diagnosis Date  . Hypothyroidism     Past Surgical History:  Procedure Laterality Date  . BLADDER INSTILLATION     2007,2012,2018 botox  . BREAST CYST EXCISION Bilateral 1985, 1995   both benign, 1 on each breast  . BREAST SURGERY     1985-right, mid 1990s-left benign (fibrocystic breast disease)  . CATARACT EXTRACTION Left 2013  . EYE SURGERY Left    done for floaters  . INGUINAL HERNIA REPAIR Left 1986  . LASIK Bilateral    2000    Current Outpatient Medications  Medication Sig Dispense Refill  . Ascorbic Acid (VITAMIN C PO) Take by mouth as needed.    . Cholecalciferol (VITAMIN D3 PO) Take 2,000 Units by mouth daily.    Marland Kitchen escitalopram (LEXAPRO) 10 MG tablet Take 1 tablet (10 mg total) by mouth daily. 90 tablet 1  . Homeopathic Products [facility] COLD REMEDY NA) by Other route in the morning and at bedtime.    Marland Kitchen MAGNESIUM PO Take 200 mg by mouth daily.    . Menaquinone-7 (VITAMIN K2) 100 MCG CAPS Take by mouth. daily    . NIACINAMIDE PO Take 1,000 mg by mouth daily.    Marland Kitchen OVER THE COUNTER MEDICATION Immunity support 1000mg  daily    . OVER THE COUNTER MEDICATION Turmeric/curcumin 1000mg  daily    . OVER THE COUNTER MEDICATION Methyl B-12 1061mcg and Methyl Folate(plus P-5-P/B-6)  495mcg once a day    . Probiotic Product (PROBIOTIC DAILY PO) Take by mouth.    . levothyroxine (SYNTHROID) 25 MCG tablet Take 1 tablet (25 mcg total) by mouth daily before breakfast. (Patient not taking: Reported on 12/23/2020) 90 tablet 0   Current Facility-Administered Medications  Medication Dose Route Frequency Provider Last Rate Last  Admin  . zoledronic acid (RECLAST) injection 5 mg  5 mg Intravenous Once Philemon Kingdom, MD        Allergies as of 12/23/2020 - Review Complete 12/23/2020  Allergen Reaction Noted  . Sulfa antibiotics Hives 01/29/2020    Family History  Problem Relation Age of Onset  . Arthritis Mother   . Hypertension Mother   . Hypertension Father   . Prostate cancer Father 36  . Basal cell carcinoma Father   . Hypertension Sister   . Colon polyps Sister   . Diabetes Mellitus II Maternal Grandmother   . Uterine cancer Maternal Grandmother        ? not certain  . Colon polyps Sister   . Colon cancer Neg Hx   . Esophageal cancer Neg Hx   . Pancreatic cancer Neg Hx     Social History   Socioeconomic History  . Marital status: Married    Spouse name: Not on file  . Number of children: Not on file  . Years of education: Not on file  . Highest education level: Not on file  Occupational History  . Not on file  Tobacco Use  . Smoking status: Never Smoker  . Smokeless tobacco: Never Used  Vaping Use  . Vaping Use: Never used  Substance and Sexual Activity  . Alcohol use: Yes    Comment: occasionally  . Drug use: Not on file  . Sexual activity: Not on file  Other Topics Concern  . Not on file  Social History Narrative  . Not on file   Social Determinants of Health   Financial Resource Strain: Not on file  Food Insecurity: Not on file  Transportation Needs: Not on file  Physical Activity: Not on file  Stress: Not on file  Social Connections: Not on file  Intimate Partner Violence: Not on file    Review of Systems: 12 system ROS is negative except as noted above.   Physical Exam: General:   Alert,  well-nourished, pleasant and cooperative in NAD Head:  Normocephalic and atraumatic. Eyes:  Sclera clear, no icterus.   Conjunctiva pink. Ears:  Normal auditory acuity. Nose:  No deformity, discharge,  or lesions. Mouth:  No deformity or lesions.   Neck:  Supple; no masses  or thyromegaly. Lungs:  Clear throughout to auscultation.   No wheezes. Heart:  Regular rate and rhythm; no murmurs. Abdomen:  Soft,nontender, nondistended, normal bowel sounds, no rebound or guarding. No hepatosplenomegaly.   Rectal:  Deferred  Msk:  Symmetrical. No boney deformities LAD: No inguinal or umbilical LAD Extremities:  No clubbing or edema. Neurologic:  Alert and  oriented x4;  grossly nonfocal Skin:  Intact without significant lesions or rashes. Psych:  Alert and cooperative. Normal mood and affect.    Giannis Corpuz L. Tarri Glenn, MD, MPH 12/23/2020, 10:35 AM

## 2020-12-23 NOTE — Patient Instructions (Addendum)
RECOMMENDATION(S):   COLONOSCOPY:   You have been scheduled for a colonoscopy. Please follow written instructions given to you at your visit today.   PREP:   Please pick up your prep supplies at the pharmacy within the next 1-3 days.  INHALERS:   If you use inhalers (even only as needed), please bring them with you on the day of your procedure.  BMI:  If you are age 63 or younger, your body mass index should be between 19-25. Your Body mass index is 18.38 kg/m. If this is out of the aformentioned range listed, please consider follow up with your Primary Care Provider.   Thank you for trusting me with your gastrointestinal care!    Thornton Park, MD, MPH

## 2021-01-01 ENCOUNTER — Ambulatory Visit (INDEPENDENT_AMBULATORY_CARE_PROVIDER_SITE_OTHER): Payer: BC Managed Care – PPO | Admitting: Internal Medicine

## 2021-01-01 ENCOUNTER — Encounter: Payer: Self-pay | Admitting: Internal Medicine

## 2021-01-01 ENCOUNTER — Other Ambulatory Visit: Payer: Self-pay

## 2021-01-01 VITALS — BP 110/70 | HR 79 | Ht 65.75 in | Wt 114.0 lb

## 2021-01-01 DIAGNOSIS — Z8639 Personal history of other endocrine, nutritional and metabolic disease: Secondary | ICD-10-CM

## 2021-01-01 DIAGNOSIS — E673 Hypervitaminosis D: Secondary | ICD-10-CM

## 2021-01-01 DIAGNOSIS — M810 Age-related osteoporosis without current pathological fracture: Secondary | ICD-10-CM | POA: Diagnosis not present

## 2021-01-01 LAB — T3, FREE: T3, Free: 2.8 pg/mL (ref 2.3–4.2)

## 2021-01-01 LAB — TSH: TSH: 3.13 u[IU]/mL (ref 0.35–4.50)

## 2021-01-01 NOTE — Patient Instructions (Addendum)
Please consider stopping K2.  We will check the thyroid tests today.  Please return in 1 year.

## 2021-01-01 NOTE — Progress Notes (Signed)
Patient ID: Sophia [name], female   DOB: 11-08-1956, 65 y.o.   MRN: [id number]   This visit occurred during the SARS-CoV-2 public health emergency.  Safety protocols were in place, including screening questions prior to the visit, additional usage of staff PPE, and extensive cleaning of exam room while observing appropriate contact time as indicated for disinfecting solutions.   HPI  Sophia [name] is a 65 y.o.-year-old female, initially referred by Dr. Marvel Plan, returning for follow-up for osteoporosis, also history of hypothyroidism and high vitamin D. She lived in [place] and then in [place] in 07/2019.  She established care with [facility] in 02/2020.  She was diagnosed with osteoporosis in 2018.  Reviewed latest DXA scans: Date L1-L3 (L4) T score FN T score 33% distal Radius  12/10/2019 ([facility] center) -2.5 RFN: -1.6 LFN: -1.5 n/a  10/10/2018 -2.2 RFN: -1.5 LFN: -1.7 n/a  10/09/2017 -2.6 RFN: -1.5 LFN:-1.8 n/a   No fractures. She did have 3 falls since moving to [place] (tripping), but she attributes this to a particular pair of shoes.  She denies dizziness/vertigo/orthostasis/poor vision  Previous osteoporosis treatments: - Actonel - for 5 years, until 2018 - Reclast 10/2017, 10/2018, 10/06/2020   No history of vitamin D deficiency.  In fact, at last visit, vitamin D was high: Lab Results  Component Value Date   VD25OH 76 08/31/2020   VD25OH 80.9 03/31/2020   VD25OH 100.64 ([facility]) 03/30/2020   VD25OH 104.70 ([place]) 01/29/2020  07/2019: vitamin D 89 She had problems with her insurance paying for her vitamin D levels.  Pt is on: - vitamin D 2000 IU + MVI >> after last visit, decreased to 1000 units + multivitamin + osteobiflex  - vitamin K2 + 400 IU vit D (total: ~2400 units)  She drinks almond milk, eats cheese, yogurt.  She does weightbearing exercises.  She also runs 2 miles a day and walks tomorrow with her dog Burnett Corrente).  Since last visit,  she started [facility] 02/2020 - once a week.  She does not take high vitamin A doses.  Menopause was at 65 y/o.  No history of HRT.  FH of osteoporosis: mother.  No hyper or hypocalcemia, hyperparathyroidism.  No history of kidney stones. Lab Results  Component Value Date   CALCIUM 9.8 08/31/2020   CALCIUM 9.8 01/29/2020   CALCIUM 9.6 08/07/2019  07/2019: Ca 9.6  She has a history of hypothyroidism, previously on levothyroxine 50 mcg daily since age 89 >> then decreased to 25 mcg last summer, and now off since 10/2020 (we stopped it to see if she actually needed it).    Latest TFTs: Lab Results  Component Value Date   TSH 2.91 08/31/2020   TSH 2.39 06/03/2020   TSH 1.92 02/28/2020   TSH 0.48 08/07/2019  07/2019: TSH 0.48  No CKD. Last BUN/Cr: Lab Results  Component Value Date   BUN 14 08/31/2020   CREATININE 0.87 08/31/2020  07/2019: BUN/Cr: 11/0.88, GFR >60  Diet: - Coffee with creamer - Breakfast: Oatmeal with almonds, flax/Chia seeds or coconut yogurt - Lunch: Skips - Midafternoon snack: Nuts, cheese, popcorn - Dinner: Patient/poultry with vegetables/greens, smoothie with almond milk  Supplements: Immunity support, Zicam chewable, turmeric, prev. On biotin 10,000 mcg daily >> stopped, methyl B12 and folate, probiotic, vitamin K2, niacinamide, magnesium, vitamin C.  She is on Lexapro to improve sleep for IC (wakes up at night frequently).  ROS: Constitutional: no weight gain/no weight loss, no fatigue, no subjective hyperthermia, no subjective hypothermia, +  increased urination and nocturia Eyes: no blurry vision, no xerophthalmia ENT: no sore throat, no nodules palpated in neck, no dysphagia, no odynophagia, no hoarseness Cardiovascular: no CP/no SOB/no palpitations/no leg swelling Respiratory: no cough/no SOB/no wheezing Gastrointestinal: no N/no V/no D/no C/no acid reflux Musculoskeletal: no muscle aches/no joint aches Skin: no rashes, no hair  loss Neurological: no tremors/no numbness/no tingling/no dizziness  I reviewed pt's medications, allergies, PMH, social hx, family hx, and changes were documented in the history of present illness. Otherwise, unchanged from my initial visit note.  PMH: -OP -Interstitial cystitis  PSxH: -Bladder injection-Botox 2007, 2012, 2018 -Cataract surgery 2013 -Vitrectomy 2012 -Laser-assisted in situ keratomileusis 2000 -Excision of cyst in the right breast-1985, left breast-1999 -Repair of inguinal hernia 1996  Social History   Socioeconomic History  . Marital status: Married    Spouse name: Not on file  . Number of children: 3  . Years of education: Not on file  . Highest education level: Not on file  Occupational History  .  Homemaker/community volunteer  Tobacco Use  . Smoking status: Not on file  Substance and Sexual Activity  . Alcohol use:  1 glass of wine once a week   Social Determinants of Health   Financial Resource Strain:   . Difficulty of Paying Living Expenses: Not on file  Food Insecurity:   . Worried About Charity fundraiser in the Last Year: Not on file  . Ran Out of Food in the Last Year: Not on file  Transportation Needs:   . Lack of Transportation (Medical): Not on file  . Lack of Transportation (Non-Medical): Not on file  Physical Activity:   . Days of Exercise per Week: Not on file  . Minutes of Exercise per Session: Not on file  Stress:   . Feeling of Stress : Not on file  Social Connections:   . Frequency of Communication with Friends and Family: Not on file  . Frequency of Social Gatherings with Friends and Family: Not on file  . Attends Religious Services: Not on file  . Active Member of Clubs or Organizations: Not on file  . Attends Archivist Meetings: Not on file  . Marital Status: Not on file  Intimate Partner Violence:   . Fear of Current or Ex-Partner: Not on file  . Emotionally Abused: Not on file  . Physically Abused: Not on  file  . Sexually Abused: Not on file   Current Outpatient Medications  Medication Sig Dispense Refill  . Ascorbic Acid (VITAMIN C PO) Take by mouth as needed.    . Cholecalciferol (VITAMIN D3 PO) Take 2,000 Units by mouth daily.    Marland Kitchen escitalopram (LEXAPRO) 10 MG tablet Take 1 tablet (10 mg total) by mouth daily. 90 tablet 1  . Homeopathic Products [facility] COLD REMEDY NA) by Other route in the morning and at bedtime.    Marland Kitchen levothyroxine (SYNTHROID) 25 MCG tablet Take 1 tablet (25 mcg total) by mouth daily before breakfast. (Patient not taking: Reported on 12/23/2020) 90 tablet 0  . MAGNESIUM PO Take 200 mg by mouth daily.    . Menaquinone-7 (VITAMIN K2) 100 MCG CAPS Take by mouth. daily    . NIACINAMIDE PO Take 1,000 mg by mouth daily.    Marland Kitchen OVER THE COUNTER MEDICATION Immunity support 1000mg  daily    . OVER THE COUNTER MEDICATION Turmeric/curcumin 1000mg  daily    . OVER THE COUNTER MEDICATION Methyl B-12 1037mcg and Methyl Folate(plus P-5-P/B-6) 458mcg once a day    .  Probiotic Product (PROBIOTIC DAILY PO) Take by mouth.     Current Facility-Administered Medications  Medication Dose Route Frequency Provider Last Rate Last Admin  . zoledronic acid (RECLAST) injection 5 mg  5 mg Intravenous Once Philemon Kingdom, MD      + Supplements: See HPI  Allergies  Allergen Reactions  . Sulfa Antibiotics Hives   FH: - Sister, M and F with HTN - MGM with uterine CA  PE: BP 110/70   Pulse 79   Ht 5' 5.75" (1.67 m)   Wt 114 lb (51.7 kg)   SpO2 97%   BMI 18.54 kg/m  Wt Readings from Last 3 Encounters:  01/01/21 114 lb (51.7 kg)  12/23/20 113 lb (51.3 kg)  08/31/20 112 lb 12.8 oz (51.2 kg)   Constitutional: normal weight, in NAD Eyes: PERRLA, EOMI, no exophthalmos ENT: moist mucous membranes, no thyromegaly, no cervical lymphadenopathy Cardiovascular: RRR, No MRG Respiratory: CTA B Gastrointestinal: abdomen soft, NT, ND, BS+ Musculoskeletal: no deformities, strength intact in all  4 Skin: moist, warm, no rashes Neurological: no tremor with outstretched hands, DTR normal in all 4  Assessment: 1. Osteoporosis  2.  Elevated vitamin D level  3. History of hypothyroidism  Plan: 1. Osteoporosis -Likely postmenopausal, she also has family history of osteoporosis -Reviewed her history latest DXA scan reports and, based on the T-scores, she has an increased risk for fracture.  However, the last report was obtained in [place], on a different DXA machine and was not directly comparable to the 2 previous ones.  The T-scores were stable at the level of the femoral necks and slightly lower, in the osteoporotic range, at the spine.  On the last report, L4 vertebra had to be excluded from analysis due to degenerative changes.  Therefore, we cannot draw definitive conclusions about the change in BMD from 2019-2021. -She has been on a p.o. bisphosphonate between 2013 and 2018 (Actonel) and then she had 2 years of Reclast.  At last visit we discussed about different medication classes, benefits, and side effects, including atypical fractures and ONJ.  At that point, I suggested to continue without Reclast for another year until the new bone density can be checked. She then had another bone density last year and we received Reclast. Of note, she has no side effects from Reclast: No thigh, hip, jaw pain.  No dental work in progress or upcoming. -At today's visit, we again discussed about different medication classes and, we discussed that if we need to restart osteoporosis medications, to proceed with Prolia. Discussed about possible side effects. She agrees with the plan. -At last visit, we discussed about switching to a more alkaline diet.  We also discussed about going to the [facility] - for skeletal loading >> she started this and is doing it for the last year.  At that time, I also recommended weightbearing exercises, discussed fall precautions, and advised her against smoking  and drinking more than 1 alcoholic drink a day. She continues to run, walk her dog, and be active. -At today's visit we reviewed her most recent BMP and vitamin D level and they were normal. Would not repeat them today. -She has another DXA scan in several days and I will contact her after the results come back to see if we can give her a drug holiday or we need to start Prolia. -Otherwise, will see pt back in 1 year  2.  Elevated vitamin D level -At last visit, her vitamin D was high,  at 104. I advised her to decrease her vitamin D supplement at that time. Her level normalized in 08/2020. She had a very high co-pay for these tests. -At this visit, she takes an approximate vitamin D daily dose of 2400 units daily. -At this visit, I advised her to stop her K2-vitamin D supplement (which includes 400 units vitamin D daily) -We will not repeat her vitamin D level today  3. History of hypothyroidism -Patient remembers that she was started on levothyroxine when she was 75. She continues on low doses, and last summer decreased from 50 to 25 mcg daily. In 10/2020, she contacted me and we decided to stop the medication to see if she actually needs it. -At today's visit, we will check a TSH, free T4, free T3 and I will let her know if we need to restart levothyroxine  Component     Latest Ref Rng & Units 01/01/2021  TSH     0.35 - 4.50 uIU/mL 3.13  T4,Free(Direct)     0.60 - 1.60 ng/dL 0.81  Triiodothyronine,Free,Serum     2.3 - 4.2 pg/mL 2.8  TFTs are normal.  No need to restart levothyroxine.  I would like to repeat them, however, in 3 to 4 months to make sure they remain normal.  Philemon Kingdom, MD PhD [facility] Endocrinology

## 2021-01-04 ENCOUNTER — Encounter: Payer: Self-pay | Admitting: Internal Medicine

## 2021-01-04 LAB — T4, FREE: Free T4: 0.81 ng/dL (ref 0.60–1.60)

## 2021-01-06 ENCOUNTER — Encounter: Payer: Self-pay | Admitting: Internal Medicine

## 2021-01-08 ENCOUNTER — Other Ambulatory Visit: Payer: BC Managed Care – PPO

## 2021-01-22 ENCOUNTER — Encounter: Payer: Self-pay | Admitting: Family Medicine

## 2021-01-25 ENCOUNTER — Encounter: Payer: Self-pay | Admitting: Family Medicine

## 2021-02-19 ENCOUNTER — Encounter: Payer: BC Managed Care – PPO | Admitting: Gastroenterology

## 2021-03-04 ENCOUNTER — Ambulatory Visit
Admission: RE | Admit: 2021-03-04 | Discharge: 2021-03-04 | Disposition: A | Discharge status: Home / Self Care | Payer: BC Managed Care – PPO | Source: Ambulatory Visit | Attending: Obstetrics and Gynecology | Admitting: Obstetrics and Gynecology

## 2021-03-04 ENCOUNTER — Other Ambulatory Visit: Payer: Self-pay

## 2021-03-04 DIAGNOSIS — Z1231 Encounter for screening mammogram for malignant neoplasm of breast: Secondary | ICD-10-CM

## 2021-03-08 ENCOUNTER — Ambulatory Visit: Payer: BC Managed Care – PPO | Admitting: Family Medicine

## 2021-03-19 ENCOUNTER — Encounter: Payer: Self-pay | Admitting: Family Medicine

## 2021-03-19 ENCOUNTER — Other Ambulatory Visit: Payer: Self-pay

## 2021-03-19 ENCOUNTER — Ambulatory Visit (INDEPENDENT_AMBULATORY_CARE_PROVIDER_SITE_OTHER): Payer: BC Managed Care – PPO | Admitting: Family Medicine

## 2021-03-19 VITALS — BP 100/60 | HR 67 | Temp 98.1°F | Ht 65.75 in | Wt 112.5 lb

## 2021-03-19 DIAGNOSIS — E039 Hypothyroidism, unspecified: Secondary | ICD-10-CM | POA: Diagnosis not present

## 2021-03-19 DIAGNOSIS — G4700 Insomnia, unspecified: Secondary | ICD-10-CM

## 2021-03-19 DIAGNOSIS — M818 Other osteoporosis without current pathological fracture: Secondary | ICD-10-CM | POA: Diagnosis not present

## 2021-03-19 MED ORDER — ESCITALOPRAM OXALATE 10 MG PO TABS: 10.0000 mg | ORAL_TABLET | Freq: Every day | ORAL | 1 refills | Status: DC

## 2021-03-19 NOTE — Progress Notes (Signed)
Sophia [name] DOB: 15-Oct-1956 Encounter date: 03/19/2021  This is a 65 y.o. female who presents with Chief Complaint  Patient presents with  . Follow-up    History of present illness: lexapro helping with sleep; not waking up early; getting more rest with this.  *not on thyroid medication now; getting reclast injections through Dr. Renne Crigler. She will have follow up with her and recheck in May.   *just started with new urologist at [place] - she is now trying pelvic floor therapy. Also has her on meloxicam and diazepam at night vaginal to see if this relaxes pelvic muscles. He is thinking that it is more pelvic floor issue than bladder issue.   *still going to osteo strong, running, lifting.  *has colonoscopy set up in June.   Allergies  Allergen Reactions  . Sulfa Antibiotics Hives   Current Meds  Medication Sig  . Ascorbic Acid (VITAMIN C PO) Take by mouth as needed.  . Cholecalciferol (VITAMIN D3 PO) Take 2,000 Units by mouth daily.  . Homeopathic Products [facility] COLD REMEDY NA) by Other route in the morning and at bedtime.  Marland Kitchen MAGNESIUM PO Take 200 mg by mouth daily.  Marland Kitchen NIACINAMIDE PO Take 1,000 mg by mouth daily.  Marland Kitchen OVER THE COUNTER MEDICATION Immunity support 1000mg  daily  . OVER THE COUNTER MEDICATION Turmeric/curcumin 1000mg  daily  . OVER THE COUNTER MEDICATION Methyl B-12 1062mcg and Methyl Folate(plus P-5-P/B-6) 440mcg once a day  . Probiotic Product (PROBIOTIC DAILY PO) Take by mouth.  . [DISCONTINUED] escitalopram (LEXAPRO) 10 MG tablet Take 1 tablet (10 mg total) by mouth daily.   Current Facility-Administered Medications for the 03/19/21 encounter (Office Visit) with Caren Macadam, MD  Medication  . zoledronic acid (RECLAST) injection 5 mg    Review of Systems  Constitutional: Negative for chills, fatigue and fever.  Respiratory: Negative for cough, chest tightness, shortness of breath and wheezing.   Cardiovascular: Negative for chest pain,  palpitations and leg swelling.    Objective:  BP 100/60 (BP Location: Left Arm, Patient Position: Sitting, Cuff Size: Normal)   Pulse 67   Temp 98.1 F (36.7 C) (Oral)   Ht 5' 5.75" (1.67 m)   Wt 112 lb 8 oz (51 kg)   SpO2 97%   BMI 18.30 kg/m   Weight: 112 lb 8 oz (51 kg)   BP Readings from Last 3 Encounters:  03/19/21 100/60  01/01/21 110/70  12/23/20 110/60   Wt Readings from Last 3 Encounters:  03/19/21 112 lb 8 oz (51 kg)  01/01/21 114 lb (51.7 kg)  12/23/20 113 lb (51.3 kg)    Physical Exam Constitutional:      General: She is not in acute distress.    Appearance: She is well-developed.  Cardiovascular:     Rate and Rhythm: Normal rate and regular rhythm.     Heart sounds: Normal heart sounds. No murmur heard. No friction rub.  Pulmonary:     Effort: Pulmonary effort is normal. No respiratory distress.     Breath sounds: Normal breath sounds. No wheezing or rales.  Musculoskeletal:     Right lower leg: No edema.     Left lower leg: No edema.  Neurological:     Mental Status: She is alert and oriented to person, place, and time.  Psychiatric:        Behavior: Behavior normal.     Assessment/Plan  1. Hypothyroidism, unspecified type Has been controlled off of medication. Will follow up with endocrinology.  2. Insomnia, unspecified type Doing well with lexapro. Refill sent today.  3. Other osteoporosis without current pathological fracture Following with endo; on reclast. Has boned density in July.    Return in about 6 months (around 09/18/2021) for physical exam.    Micheline Rough, MD

## 2021-03-26 ENCOUNTER — Encounter: Payer: BC Managed Care – PPO | Admitting: Gastroenterology

## 2021-03-30 ENCOUNTER — Encounter: Payer: Self-pay | Admitting: Family Medicine

## 2021-04-07 ENCOUNTER — Other Ambulatory Visit (INDEPENDENT_AMBULATORY_CARE_PROVIDER_SITE_OTHER): Payer: BC Managed Care – PPO

## 2021-04-07 ENCOUNTER — Other Ambulatory Visit: Payer: Self-pay

## 2021-04-07 DIAGNOSIS — Z8639 Personal history of other endocrine, nutritional and metabolic disease: Secondary | ICD-10-CM | POA: Diagnosis not present

## 2021-04-08 LAB — T3, FREE: T3, Free: 2.3 pg/mL (ref 2.3–4.2)

## 2021-04-08 LAB — T4, FREE: Free T4: 0.73 ng/dL (ref 0.60–1.60)

## 2021-04-08 LAB — TSH: TSH: 3.82 u[IU]/mL (ref 0.35–4.50)

## 2021-04-09 ENCOUNTER — Encounter: Payer: Self-pay | Admitting: Internal Medicine

## 2021-04-19 ENCOUNTER — Other Ambulatory Visit: Payer: Self-pay | Admitting: Family Medicine

## 2021-04-21 ENCOUNTER — Other Ambulatory Visit: Payer: Self-pay

## 2021-04-21 ENCOUNTER — Ambulatory Visit (AMBULATORY_SURGERY_CENTER): Payer: Self-pay

## 2021-04-21 VITALS — Ht 65.75 in | Wt 112.0 lb

## 2021-04-21 DIAGNOSIS — Z86010 Personal history of colonic polyps: Secondary | ICD-10-CM

## 2021-04-21 DIAGNOSIS — Z8371 Family history of colonic polyps: Secondary | ICD-10-CM

## 2021-04-21 MED ORDER — ONDANSETRON HCL 4 MG PO TABS: 4.0000 mg | ORAL_TABLET | ORAL | 0 refills | Status: DC

## 2021-04-21 NOTE — Progress Notes (Signed)
Patient is here in-person for PV. Patient denies any allergies to eggs or soy. Patient denies any problems with anesthesia/sedation. Patient denies any oxygen use at home. Patient denies taking any diet/weight loss medications or blood thinners. Patient is not being treated for MRSA or C-diff. Patient is aware of our care-partner policy and [id number] safety protocol. EMMI education assigned to the patient for the procedure, sent to [facility].   Patient is COVID-19 vaccinated, per patient.   Pt denies any medical hx changes from last GI office visit.  Pt had nausea last prep - Zofran given.  maw

## 2021-04-26 DIAGNOSIS — M79672 Pain in left foot: Secondary | ICD-10-CM | POA: Insufficient documentation

## 2021-04-26 DIAGNOSIS — M7741 Metatarsalgia, right foot: Secondary | ICD-10-CM | POA: Insufficient documentation

## 2021-05-06 ENCOUNTER — Encounter: Payer: Self-pay | Admitting: Gastroenterology

## 2021-05-07 ENCOUNTER — Ambulatory Visit (AMBULATORY_SURGERY_CENTER): Payer: BC Managed Care – PPO | Admitting: Gastroenterology

## 2021-05-07 ENCOUNTER — Encounter: Payer: Self-pay | Admitting: Gastroenterology

## 2021-05-07 ENCOUNTER — Other Ambulatory Visit: Payer: Self-pay

## 2021-05-07 VITALS — BP 127/72 | HR 79 | Temp 97.8°F | Resp 19 | Ht 65.75 in | Wt 112.0 lb

## 2021-05-07 DIAGNOSIS — Z86010 Personal history of colonic polyps: Secondary | ICD-10-CM

## 2021-05-07 DIAGNOSIS — K635 Polyp of colon: Secondary | ICD-10-CM | POA: Diagnosis not present

## 2021-05-07 DIAGNOSIS — K621 Rectal polyp: Secondary | ICD-10-CM

## 2021-05-07 DIAGNOSIS — D122 Benign neoplasm of ascending colon: Secondary | ICD-10-CM

## 2021-05-07 DIAGNOSIS — Z8371 Family history of colonic polyps: Secondary | ICD-10-CM

## 2021-05-07 DIAGNOSIS — D128 Benign neoplasm of rectum: Secondary | ICD-10-CM

## 2021-05-07 MED ORDER — SODIUM CHLORIDE 0.9 % IV SOLN: 500.0000 mL | Freq: Once | INTRAVENOUS | Status: DC

## 2021-05-07 NOTE — Patient Instructions (Signed)
Handouts on polyps & hemorrhoids given to you today  Await pathology results on polyps removed     YOU HAD AN ENDOSCOPIC PROCEDURE TODAY AT [facility]:   Refer to the procedure report that was given to you for any specific questions about what was found during the examination.  If the procedure report does not answer your questions, please call your gastroenterologist to clarify.  If you requested that your care partner not be given the details of your procedure findings, then the procedure report has been included in a sealed envelope for you to review at your convenience later.  YOU SHOULD EXPECT: Some feelings of bloating in the abdomen. Passage of more gas than usual.  Walking can help get rid of the air that was put into your GI tract during the procedure and reduce the bloating. If you had a lower endoscopy (such as a colonoscopy or flexible sigmoidoscopy) you may notice spotting of blood in your stool or on the toilet paper. If you underwent a bowel prep for your procedure, you may not have a normal bowel movement for a few days.  Please Note:  You might notice some irritation and congestion in your nose or some drainage.  This is from the oxygen used during your procedure.  There is no need for concern and it should clear up in a day or so.  SYMPTOMS TO REPORT IMMEDIATELY:  Following lower endoscopy (colonoscopy or flexible sigmoidoscopy):  Excessive amounts of blood in the stool  Significant tenderness or worsening of abdominal pains  Swelling of the abdomen that is new, acute  Fever of 100F or higher    For urgent or emergent issues, a gastroenterologist can be reached at any hour by calling [phone]. Do not use MyChart messaging for urgent concerns.    DIET:  We do recommend a small meal at first, but then you may proceed to your regular diet.  Drink plenty of fluids but you should avoid alcoholic beverages for 24 hours.  ACTIVITY:  You should plan  to take it easy for the rest of today and you should NOT DRIVE or use heavy machinery until tomorrow (because of the sedation medicines used during the test).    FOLLOW UP: Our staff will call the number listed on your records 48-72 hours following your procedure to check on you and address any questions or concerns that you may have regarding the information given to you following your procedure. If we do not reach you, we will leave a message.  We will attempt to reach you two times.  During this call, we will ask if you have developed any symptoms of COVID 19. If you develop any symptoms (ie: fever, flu-like symptoms, shortness of breath, cough etc.) before then, please call [phone].  If you test positive for Covid 19 in the 2 weeks post procedure, please call and report this information to [place].    If any biopsies were taken you will be contacted by phone or by letter within the next 1-3 weeks.  Please call us at [phone] if you have not heard about the biopsies in 3 weeks.    SIGNATURES/CONFIDENTIALITY: You and/or your care partner have signed paperwork which will be entered into your electronic medical record.  These signatures attest to the fact that that the information above on your After Visit Summary has been reviewed and is understood.  Full responsibility of the confidentiality of this discharge information lies with you  and/or your care-partner.

## 2021-05-07 NOTE — Progress Notes (Signed)
Report given to PACU, vss 

## 2021-05-07 NOTE — Progress Notes (Signed)
0805 Robinul 0.2 mg IV given due large amount of secretions upon assessment.  MD made aware, vss

## 2021-05-07 NOTE — Progress Notes (Signed)
0800 Patient experiencing nausea.  MD updated and Zofran 4 mg IV given, vss

## 2021-05-07 NOTE — Op Note (Addendum)
[facility] Patient Name: Sophia [name] Procedure Date: 05/07/2021 7:21 AM MRN: [id number] Endoscopist: Thornton Park MD, MD Age: 65 Referring MD:  Date of Birth: [birth year] Gender: Female Account #: [id number] Procedure:                Colonoscopy Indications:              High risk colon cancer surveillance: Personal                            history of colonic polyps                           History of colon polyp - tubular adenoma on                            colonoscopy 2019                           Family history of colon polyps (2 sisters)                           Incidental history: Altered bowel habits for 10                            years Medicines:                Monitored Anesthesia Care Procedure:                Pre-Anesthesia Assessment:                           - Prior to the procedure, a History and Physical                            was performed, and patient medications and                            allergies were reviewed. The patient's tolerance of                            previous anesthesia was also reviewed. The risks                            and benefits of the procedure and the sedation                            options and risks were discussed with the patient.                            All questions were answered, and informed consent                            was obtained. Prior Anticoagulants: The patient has                            taken no previous anticoagulant  or antiplatelet                            agents. ASA Grade Assessment: II - A patient with                            mild systemic disease. After reviewing the risks                            and benefits, the patient was deemed in                            satisfactory condition to undergo the procedure.                           After obtaining informed consent, the colonoscope                            was passed under direct vision. Throughout the                             procedure, the patient's blood pressure, pulse, and                            oxygen saturations were monitored continuously. The                            Olympus CF-HQ190L (Serial# [id number]) Colonoscope was                            introduced through the anus and advanced to the 3                            cm into the ileum. A second forward view of the                            right colon was performed. The colonoscopy was                            performed with moderate difficulty due to                            significant looping. Successful completion of the                            procedure was aided by applying abdominal pressure.                            The patient tolerated the procedure well. The                            quality of the bowel preparation was good. The  terminal ileum, ileocecal valve, appendiceal                            orifice, and rectum were photographed. Scope In: 8:02:30 AM Scope Out: 8:22:30 AM Scope Withdrawal Time: 0 hours 15 minutes 20 seconds  Total Procedure Duration: 0 hours 20 minutes 0 seconds  Findings:                 The perianal and digital rectal examinations were                            normal.                           A less than 1 mm polyp was found in the rectum. The                            polyp was flat. The polyp was removed with a cold                            biopsy forceps after failed attempt at resection                            with a snare. Resection and retrieval were                            complete. Estimated blood loss was minimal.                           A 1 mm polyp was found in the distal ascending                            colon. The polyp was flat. The polyp was removed                            with a cold snare. Resection and retrieval were                            complete. Estimated blood loss was minimal.                            The exam was otherwise without abnormality on                            direct and retroflexion views except for small                            internal hemorrhoids. Complications:            No immediate complications. Estimated blood loss:                            Minimal. Estimated Blood Loss:     Estimated blood loss was minimal. Impression:               -  One less than 1 mm polyp in the rectum, removed                            with a cold biopsy forceps. Resected and retrieved.                           - One 1 mm polyp in the distal ascending colon,                            removed with a cold snare. Resected and retrieved.                           - Small internal hemorrhoids.                           - The examination was otherwise normal on direct                            and retroflexion views. Recommendation:           - Patient has a contact number available for                            emergencies. The signs and symptoms of potential                            delayed complications were discussed with the                            patient. Return to normal activities tomorrow.                            Written discharge instructions were provided to the                            patient.                           - Resume previous diet.                           - Continue present medications.                           - Await pathology results.                           - Repeat colonoscopy in 5 years for surveillance                            given the family history of colon polyps.                           - Emerging evidence supports eating a diet of  fruits, vegetables, grains, calcium, and yogurt                            while reducing red meat and alcohol may reduce the                            risk of colon cancer.                           - Thank you for allowing me to be involved in your                             colon cancer prevention. Thornton Park MD, MD 05/07/2021 8:28:54 AM This report has been signed electronically.

## 2021-05-07 NOTE — Progress Notes (Signed)
Called to room to assist during endoscopic procedure.  Patient ID and intended procedure confirmed with present staff. Received instructions for my participation in the procedure from the performing physician.  

## 2021-05-07 NOTE — Progress Notes (Signed)
Pt's states no medical or surgical changes since previsit or office visit. 

## 2021-05-11 ENCOUNTER — Telehealth: Payer: Self-pay

## 2021-05-11 NOTE — Telephone Encounter (Signed)
  Follow up Call-  Call back number 05/07/2021  Post procedure Call Back phone  # [phone]  Permission to leave phone message Yes  Some recent data might be hidden     Patient questions:  Do you have a fever, pain , or abdominal swelling? No. Pain Score  0 *  Have you tolerated food without any problems? Yes.    Have you been able to return to your normal activities? Yes.    Do you have any questions about your discharge instructions: Diet   No. Medications  No. Follow up visit  No.  Do you have questions or concerns about your Care? No.  Actions: * If pain score is 4 or above: No action needed, pain <4. Have you developed a fever since your procedure? no  2.   Have you had an respiratory symptoms (SOB or cough) since your procedure? no  3.   Have you tested positive for COVID 19 since your procedure no  4.   Have you had any family members/close contacts diagnosed with the COVID 19 since your procedure?  no   If yes to any of these questions please route to Joylene John, RN and Joella Prince, RN

## 2021-05-18 ENCOUNTER — Encounter: Payer: Self-pay | Admitting: Gastroenterology

## 2021-05-25 DIAGNOSIS — M6281 Muscle weakness (generalized): Secondary | ICD-10-CM | POA: Diagnosis not present

## 2021-05-25 DIAGNOSIS — M62838 Other muscle spasm: Secondary | ICD-10-CM | POA: Diagnosis not present

## 2021-05-25 DIAGNOSIS — M6289 Other specified disorders of muscle: Secondary | ICD-10-CM | POA: Diagnosis not present

## 2021-05-25 DIAGNOSIS — R3915 Urgency of urination: Secondary | ICD-10-CM | POA: Diagnosis not present

## 2021-06-09 ENCOUNTER — Other Ambulatory Visit: Payer: Self-pay

## 2021-06-09 ENCOUNTER — Ambulatory Visit
Admission: RE | Admit: 2021-06-09 | Discharge: 2021-06-09 | Disposition: A | Discharge status: Home / Self Care | Payer: BC Managed Care – PPO | Source: Ambulatory Visit | Attending: Internal Medicine | Admitting: Internal Medicine

## 2021-06-09 DIAGNOSIS — M810 Age-related osteoporosis without current pathological fracture: Secondary | ICD-10-CM | POA: Diagnosis not present

## 2021-06-09 DIAGNOSIS — M85851 Other specified disorders of bone density and structure, right thigh: Secondary | ICD-10-CM | POA: Diagnosis not present

## 2021-06-09 DIAGNOSIS — Z780 Asymptomatic menopausal state: Secondary | ICD-10-CM | POA: Diagnosis not present

## 2021-06-10 LAB — HM DEXA SCAN

## 2021-06-16 ENCOUNTER — Encounter: Payer: Self-pay | Admitting: Internal Medicine

## 2021-06-25 ENCOUNTER — Other Ambulatory Visit: Payer: Self-pay

## 2021-06-25 ENCOUNTER — Encounter: Payer: Self-pay | Admitting: Family Medicine

## 2021-06-25 ENCOUNTER — Ambulatory Visit (INDEPENDENT_AMBULATORY_CARE_PROVIDER_SITE_OTHER): Payer: BC Managed Care – PPO | Admitting: Family Medicine

## 2021-06-25 VITALS — BP 108/50 | HR 77 | Temp 98.4°F | Ht 65.75 in | Wt 112.1 lb

## 2021-06-25 DIAGNOSIS — M25562 Pain in left knee: Secondary | ICD-10-CM | POA: Diagnosis not present

## 2021-06-25 DIAGNOSIS — M25862 Other specified joint disorders, left knee: Secondary | ICD-10-CM

## 2021-06-25 NOTE — Progress Notes (Signed)
  Sophia [name] DOB: 09/17/1956 Encounter date: 06/25/2021  This is a 65 y.o. female who presents with Chief Complaint  Patient presents with   Cyst    Patient complains of a knot lateral aspect of left knee x2 days, no known injury    History of present illness: Was shaving legs a couple of nights ago - noted lump on left knee. Was walking a lot over weekend in [place]. Not sore at all. No other swelling.    Allergies  Allergen Reactions   Sulfa Antibiotics Hives   Current Meds  Medication Sig   Ascorbic Acid (VITAMIN C PO) Take by mouth as needed.   Cholecalciferol (VITAMIN D3 PO) Take 2,000 Units by mouth daily.   escitalopram (LEXAPRO) 10 MG tablet Take 1 tablet by mouth once daily   Homeopathic Products (ZICAM COLD REMEDY NA) by Other route in the morning and at bedtime.   MAGNESIUM PO Take 200 mg by mouth daily.   NIACINAMIDE PO Take 1,000 mg by mouth daily.   ondansetron (ZOFRAN) 4 MG tablet Take 1 tablet (4 mg total) by mouth as directed. 1 tablet 30-60 minutes prior to each prep dose   OVER THE COUNTER MEDICATION Immunity support '1000mg'$  daily   OVER THE COUNTER MEDICATION Turmeric/curcumin '1000mg'$  daily   OVER THE COUNTER MEDICATION Methyl B-12 1054mg and Methyl Folate(plus P-5-P/B-6) 4068m once a day   Probiotic Product (PROBIOTIC DAILY PO) Take by mouth.   Current Facility-Administered Medications for the 06/25/21 encounter (Office Visit) with KoCaren MacadamMD  Medication   zoledronic acid (RECLAST) injection 5 mg    Review of Systems  Constitutional:  Negative for chills, fatigue and fever.  Respiratory:  Negative for cough, chest tightness, shortness of breath and wheezing.   Cardiovascular:  Negative for chest pain, palpitations and leg swelling.  Musculoskeletal:        See hpi    Objective:  BP (!) 108/50 (BP Location: Left Arm, Patient Position: Sitting, Cuff Size: Normal)   Pulse 77   Temp 98.4 F (36.9 C) (Oral)   Ht 5' 5.75" (1.67 m)    Wt 112 lb 1.6 oz (50.8 kg)   BMI 18.23 kg/m   Weight: 112 lb 1.6 oz (50.8 kg)   BP Readings from Last 3 Encounters:  06/25/21 (!) 108/50  05/07/21 127/72  03/19/21 100/60   Wt Readings from Last 3 Encounters:  06/25/21 112 lb 1.6 oz (50.8 kg)  05/07/21 112 lb (50.8 kg)  04/21/21 112 lb (50.8 kg)    Physical Exam Pulmonary:     Effort: Pulmonary effort is normal.  Neurological:     Mental Status: She is alert.  Psychiatric:        Mood and Affect: Mood normal.    Assessment/Plan  1. Posterior left knee pain Small bakers cyst suspected. She is curious about sudden appearance of cyst tibial, prefers sports med for evaluation. - Ambulatory referral to Sports Medicine  2. Cyst of knee joint, left - Ambulatory referral to Sports Medicine   Return if symptoms worsen or fail to improve.    JuMicheline RoughMD

## 2021-06-29 ENCOUNTER — Encounter: Payer: Self-pay | Admitting: Family Medicine

## 2021-07-01 ENCOUNTER — Ambulatory Visit (INDEPENDENT_AMBULATORY_CARE_PROVIDER_SITE_OTHER): Payer: BC Managed Care – PPO | Admitting: Family Medicine

## 2021-07-01 ENCOUNTER — Ambulatory Visit: Payer: Self-pay

## 2021-07-01 ENCOUNTER — Other Ambulatory Visit: Payer: Self-pay

## 2021-07-01 ENCOUNTER — Ambulatory Visit (INDEPENDENT_AMBULATORY_CARE_PROVIDER_SITE_OTHER): Payer: BC Managed Care – PPO

## 2021-07-01 VITALS — BP 102/64 | HR 75 | Ht 65.75 in | Wt 111.0 lb

## 2021-07-01 DIAGNOSIS — M6789 Other specified disorders of synovium and tendon, multiple sites: Secondary | ICD-10-CM

## 2021-07-01 DIAGNOSIS — M25562 Pain in left knee: Secondary | ICD-10-CM

## 2021-07-01 DIAGNOSIS — M7139 Other bursal cyst, multiple sites: Secondary | ICD-10-CM

## 2021-07-01 DIAGNOSIS — M216X9 Other acquired deformities of unspecified foot: Secondary | ICD-10-CM | POA: Diagnosis not present

## 2021-07-01 DIAGNOSIS — M6749 Ganglion, multiple sites: Secondary | ICD-10-CM | POA: Diagnosis not present

## 2021-07-01 NOTE — Patient Instructions (Addendum)
Good to see you  Drained cyst today  [employer] orthotics Hoka or Oofos recovery sandals for the house Newton running shoes We will send you exercises through mychart on Monday.  Xray of left knee on the way out  Do not lace last eye on shoe See me again in 6 weeks just in case

## 2021-07-01 NOTE — Progress Notes (Signed)
[facility] [place] Phone: [phone] Subjective:   Rito Ehrlich, am serving as a scribe for Dr. Hulan Saas.  I'm seeing this patient by the request  of:  Caren Macadam, MD  CC: Left knee pain  [id number]  Sophia [name] is a 65 y.o. female coming in with complaint of left knee pain.  Was seen by primary care provider in 6 days ago.  Was concern for possible Baker's cyst.  Referred here for further evaluation.  Patient states not in any pain but a bump showed up over night on lateral L knee.  Patient states that she did not have any injury that she knows of.  Denies any family history of cancer.  Patient states that it is only a little uncomfortable when talking on it otherwise fairly knows it is there.  Just more wondering what it is and why it is there        Past Medical History:  Diagnosis Date   Hypothyroidism    Past Surgical History:  Procedure Laterality Date   BLADDER INSTILLATION     2007,2012,2018 botox   BREAST CYST EXCISION Bilateral 1985, 1995   both benign, 1 on each breast   BREAST SURGERY     1985-right, mid 1990s-left benign (fibrocystic breast disease)   CATARACT EXTRACTION Left 2013   EYE SURGERY Left    done for floaters   INGUINAL HERNIA REPAIR Left 1986   LASIK Bilateral    2000   Social History   Socioeconomic History   Marital status: Married    Spouse name: Not on file   Number of children: Not on file   Years of education: Not on file   Highest education level: Not on file  Occupational History   Not on file  Tobacco Use   Smoking status: Never   Smokeless tobacco: Never  Vaping Use   Vaping Use: Never used  Substance and Sexual Activity   Alcohol use: Yes    Comment: occasionally   Drug use: Not on file   Sexual activity: Not on file  Other Topics Concern   Not on file  Social History Narrative   Not on file   Social Determinants of Health    Financial Resource Strain: Not on file  Food Insecurity: Not on file  Transportation Needs: Not on file  Physical Activity: Not on file  Stress: Not on file  Social Connections: Not on file   Allergies  Allergen Reactions   Sulfa Antibiotics Hives   Family History  Problem Relation Age of Onset   Arthritis Mother    Hypertension Mother    Hypertension Father    Prostate cancer Father 28   Basal cell carcinoma Father    Hypertension Sister    Colon polyps Sister    Diabetes Mellitus II Maternal Grandmother    Uterine cancer Maternal Grandmother        ? not certain   Colon polyps Sister    Colon cancer Neg Hx    Esophageal cancer Neg Hx    Pancreatic cancer Neg Hx      Current Facility-Administered Medications (Endocrine & Metabolic):    zoledronic acid (RECLAST) injection 5 mg          Current Outpatient Medications (Other):    Ascorbic Acid (VITAMIN C PO), Take by mouth as needed.   Cholecalciferol (VITAMIN D3 PO), Take 2,000 Units by mouth daily.   escitalopram (  LEXAPRO) 10 MG tablet, Take 1 tablet by mouth once daily   Homeopathic Products (ZICAM COLD REMEDY NA), by Other route in the morning and at bedtime.   MAGNESIUM PO, Take 200 mg by mouth daily.   NIACINAMIDE PO, Take 1,000 mg by mouth daily.   ondansetron (ZOFRAN) 4 MG tablet, Take 1 tablet (4 mg total) by mouth as directed. 1 tablet 30-60 minutes prior to each prep dose   OVER THE COUNTER MEDICATION, Immunity support '1000mg'$  daily   OVER THE COUNTER MEDICATION, Turmeric/curcumin '1000mg'$  daily   OVER THE COUNTER MEDICATION, Methyl B-12 1052mg and Methyl Folate(plus P-5-P/B-6) 4041m once a day   Probiotic Product (PROBIOTIC DAILY PO), Take by mouth.    Reviewed prior external information including notes and imaging from  primary care provider As well as notes that were available from care everywhere and other healthcare systems.  Past medical history, social, surgical and family history all  reviewed in electronic medical record.  No pertanent information unless stated regarding to the chief complaint.   Review of Systems:  No headache, visual changes, nausea, vomiting, diarrhea, constipation, dizziness, abdominal pain, skin rash, fevers, chills, night sweats, weight loss, swollen lymph nodes, body aches, joint swelling, chest pain, shortness of breath, mood changes. POSITIVE muscle aches  Objective  Blood pressure 102/64, pulse 75, height 5' 5.75" (1.67 m), weight 111 lb (50.3 kg), SpO2 98 %.   General: No apparent distress alert and oriented x3 mood and affect normal, dressed appropriately.  HEENT: Pupils equal, extraocular movements intact  Respiratory: Patient's speak in full sentences and does not appear short of breath  Cardiovascular: No lower extremity edema, non tender, no erythema  Gait normal with good balance and coordination.  MSK:  \Knee exam shows patient does have swelling over the anterior lateral aspect of the knee.  Seems to be near the fibular head.  Seems to be more cystic in nature.  Minorly tender and freely movable.  No breakdown of the skin.  Somewhat consistent with a ganglion cyst.  Patient is knee does have a fairly good range of motion.  Patient's feet bilaterally do have severe breakdown of the transverse arch with bunion and bunionette formation noted.  Patient does have some fibular deviation of the large toe bilaterally but no true subluxation.  Limited muscular skeletal ultrasound was performed and interpreted by SMHulan SaasM  Limited ultrasound of patient's left knee shows the patient does have a cyst noted that with the hypoechoic changes could be consistent with a ganglion cyst.  Cannot quite appreciate the depth of it.  Possibly septated. Impression: Ganglion cyst  Procedure: Real-time Ultrasound Guided Injection and aspiration of left knee ganglion cyst Device: GE Logiq Q7 Ultrasound guided injection is preferred based studies that show  increased duration, increased effect, greater accuracy, decreased procedural pain, increased response rate, and decreased cost with ultrasound guided versus blind injection.  Verbal informed consent obtained.  Time-out conducted.  Noted no overlying erythema, induration, or other signs of local infection.  Skin prepped in a sterile fashion.  Local anesthesia: Topical Ethyl chloride.  With sterile technique and under real time ultrasound guidance: With an 18-gauge 1-1/2 inch needle patient was injected with 1 cc of 0.5% Marcaine and then 10 6 cc of gel like fluid material removed in the syringe and then had pressure applied to get the rest of it out.  Patient then had an injection of 1 cc of Kenalog 40 mg/mL. Completed without difficulty  Pain immediately resolved  suggesting accurate placement of the medication.  Advised to call if fevers/chills, erythema, induration, drainage, or persistent bleeding.  Impression: Technically successful ultrasound guided injection.    Impression and Recommendations:     The above documentation has been reviewed and is accurate and complete Lyndal Pulley, DO

## 2021-07-02 ENCOUNTER — Encounter: Payer: Self-pay | Admitting: Family Medicine

## 2021-07-03 ENCOUNTER — Encounter: Payer: Self-pay | Admitting: Family Medicine

## 2021-07-03 DIAGNOSIS — M6789 Other specified disorders of synovium and tendon, multiple sites: Secondary | ICD-10-CM | POA: Insufficient documentation

## 2021-07-03 DIAGNOSIS — M6749 Ganglion, multiple sites: Secondary | ICD-10-CM | POA: Insufficient documentation

## 2021-07-03 DIAGNOSIS — M216X9 Other acquired deformities of unspecified foot: Secondary | ICD-10-CM | POA: Insufficient documentation

## 2021-07-03 NOTE — Assessment & Plan Note (Signed)
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  Discussed over-the-counter orthotics and proper running shoes.  Discussed avoiding being barefoot in the house.  Discussed the importance of keeping strengthening to attempt to slow down progression.  Patient does talk to a Psychologist, sport and exercise about an previously.  Follow-up with me again in 6 weeks

## 2021-07-03 NOTE — Assessment & Plan Note (Signed)
Patient had aspiration done.  Tolerated the procedure well.  Patient will do compression.  There is a chance for possible reaccumulation.  Warned patient of signs and symptoms of any type of reaction accumulation or infectious etiology.  Patient will come back in 6 weeks but can come back sooner if we do think that this does reaccumulate sooner.  If so we also need to consider the possibility of advanced imaging.

## 2021-07-06 DIAGNOSIS — R3915 Urgency of urination: Secondary | ICD-10-CM | POA: Diagnosis not present

## 2021-07-06 DIAGNOSIS — R350 Frequency of micturition: Secondary | ICD-10-CM | POA: Diagnosis not present

## 2021-07-13 DIAGNOSIS — R350 Frequency of micturition: Secondary | ICD-10-CM | POA: Diagnosis not present

## 2021-07-20 DIAGNOSIS — R350 Frequency of micturition: Secondary | ICD-10-CM | POA: Diagnosis not present

## 2021-07-27 DIAGNOSIS — R350 Frequency of micturition: Secondary | ICD-10-CM | POA: Diagnosis not present

## 2021-08-03 DIAGNOSIS — R350 Frequency of micturition: Secondary | ICD-10-CM | POA: Diagnosis not present

## 2021-08-10 DIAGNOSIS — R3915 Urgency of urination: Secondary | ICD-10-CM | POA: Diagnosis not present

## 2021-08-10 DIAGNOSIS — R350 Frequency of micturition: Secondary | ICD-10-CM | POA: Diagnosis not present

## 2021-08-11 NOTE — Progress Notes (Signed)
Zach Veronda Gabor [place] [address] [place] Phone: [phone] Subjective:   IVilma Meckel, am serving as a scribe for Dr. Hulan Saas. This visit occurred during the SARS-CoV-2 public health emergency.  Safety protocols were in place, including screening questions prior to the visit, additional usage of staff PPE, and extensive cleaning of exam room while observing appropriate contact time as indicated for disinfecting solutions.   I'm seeing this patient by the request  of:  Caren Macadam, MD  CC: Left knee pain, foot pain follow-up  [id number]  07/01/2021 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  Discussed over-the-counter orthotics and proper running shoes.  Discussed avoiding being barefoot in the house.  Discussed the importance of keeping strengthening to attempt to slow down progression.  Patient does talk to a Psychologist, sport and exercise about an previously.  Follow-up with me again in 6 weeks  Patient had aspiration done.  Tolerated the procedure well.  Patient will do compression.  There is a chance for possible reaccumulation.  Warned patient of signs and symptoms of any type of reaction accumulation or infectious etiology.  Patient will come back in 6 weeks but can come back sooner if we do think that this does reaccumulate sooner.  If so we also need to consider the possibility of advanced imaging.  Update 08/12/2021 Jermiya Reichl Cryer is a 65 y.o. female coming in with complaint of L knee pain. Patient states there is no pain in her left knee, but fluid that is self draining, maybe from a cyst that is causing her to be uncomfortable every now and again. She wants to talk about her feet, which are also not in pain, but just uncomfortable.  Xray L knee 07/01/2021 IMPRESSION: 1. No acute findings. 2. Minimal early degenerative changes.     Past Medical History:  Diagnosis Date   Hypothyroidism    Past Surgical History:   Procedure Laterality Date   BLADDER INSTILLATION     2007,2012,2018 botox   BREAST CYST EXCISION Bilateral 1985, 1995   both benign, 1 on each breast   BREAST SURGERY     1985-right, mid 1990s-left benign (fibrocystic breast disease)   CATARACT EXTRACTION Left 2013   EYE SURGERY Left    done for floaters   INGUINAL HERNIA REPAIR Left 1986   LASIK Bilateral    2000   Social History   Socioeconomic History   Marital status: Married    Spouse name: Not on file   Number of children: Not on file   Years of education: Not on file   Highest education level: Not on file  Occupational History   Not on file  Tobacco Use   Smoking status: Never   Smokeless tobacco: Never  Vaping Use   Vaping Use: Never used  Substance and Sexual Activity   Alcohol use: Yes    Comment: occasionally   Drug use: Not on file   Sexual activity: Not on file  Other Topics Concern   Not on file  Social History Narrative   Not on file   Social Determinants of Health   Financial Resource Strain: Not on file  Food Insecurity: Not on file  Transportation Needs: Not on file  Physical Activity: Not on file  Stress: Not on file  Social Connections: Not on file   Allergies  Allergen Reactions   Sulfa Antibiotics Hives   Family History  Problem Relation Age of Onset   Arthritis Mother  Hypertension Mother    Hypertension Father    Prostate cancer Father 28   Basal cell carcinoma Father    Hypertension Sister    Colon polyps Sister    Diabetes Mellitus II Maternal Grandmother    Uterine cancer Maternal Grandmother        ? not certain   Colon polyps Sister    Colon cancer Neg Hx    Esophageal cancer Neg Hx    Pancreatic cancer Neg Hx      Current Facility-Administered Medications (Endocrine & Metabolic):    zoledronic acid (RECLAST) injection 5 mg          Current Outpatient Medications (Other):    Ascorbic Acid (VITAMIN C PO), Take by mouth as needed.   Cholecalciferol  (VITAMIN D3 PO), Take 2,000 Units by mouth daily.   escitalopram (LEXAPRO) 10 MG tablet, Take 1 tablet by mouth once daily   Homeopathic Products (ZICAM COLD REMEDY NA), by Other route in the morning and at bedtime.   MAGNESIUM PO, Take 200 mg by mouth daily.   NIACINAMIDE PO, Take 1,000 mg by mouth daily.   ondansetron (ZOFRAN) 4 MG tablet, Take 1 tablet (4 mg total) by mouth as directed. 1 tablet 30-60 minutes prior to each prep dose   OVER THE COUNTER MEDICATION, Immunity support 1000mg  daily   OVER THE COUNTER MEDICATION, Turmeric/curcumin 1000mg  daily   OVER THE COUNTER MEDICATION, Methyl B-12 1077mcg and Methyl Folate(plus P-5-P/B-6) 480mcg once a day   Probiotic Product (PROBIOTIC DAILY PO), Take by mouth.    Reviewed prior external information including notes and imaging from  primary care provider As well as notes that were available from care everywhere and other healthcare systems.  Past medical history, social, surgical and family history all reviewed in electronic medical record.  No pertanent information unless stated regarding to the chief complaint.   Review of Systems:  No headache, visual changes, nausea, vomiting, diarrhea, constipation, dizziness, abdominal pain, skin rash, fevers, chills, night sweats, weight loss, swollen lymph nodes, body aches, joint swelling, chest pain, shortness of breath, mood changes. POSITIVE muscle aches  Objective  Blood pressure (!) 96/54, pulse 73, height 5\' 5"  (1.651 m), weight 112 lb (50.8 kg), SpO2 97 %.   General: No apparent distress alert and oriented x3 mood and affect normal, dressed appropriately.  HEENT: Pupils equal, extraocular movements intact  Respiratory: Patient's speak in full sentences and does not appear short of breath  Cardiovascular: No lower extremity edema, non tender, no erythema  Gait normal with good balance and coordination.  MSK: Patient is likely still has a cyst remaining.  Possibly smaller than what it  was previously but difficult to assess.  Seems to be over the femoral head on the lateral aspect of the knee joint.  Freely movable but is tender.  No overlying erythema.  Knee appears to be stable otherwise. Patient's foot exam shows the patient does have bunion and bunionette formation noted.  Patient does have breakdown of the transverse arch right greater than left.  Patient does have some fibular deviation of the toes on the right foot noted.    Impression and Recommendations:     The above documentation has been reviewed and is accurate and complete Lyndal Pulley, DO

## 2021-08-12 ENCOUNTER — Encounter: Payer: Self-pay | Admitting: Family Medicine

## 2021-08-12 ENCOUNTER — Ambulatory Visit (INDEPENDENT_AMBULATORY_CARE_PROVIDER_SITE_OTHER): Payer: BC Managed Care – PPO | Admitting: Family Medicine

## 2021-08-12 VITALS — BP 96/54 | HR 73 | Ht 65.0 in | Wt 112.0 lb

## 2021-08-12 DIAGNOSIS — M7139 Other bursal cyst, multiple sites: Secondary | ICD-10-CM | POA: Diagnosis not present

## 2021-08-12 DIAGNOSIS — M216X9 Other acquired deformities of unspecified foot: Secondary | ICD-10-CM

## 2021-08-12 DIAGNOSIS — M6789 Other specified disorders of synovium and tendon, multiple sites: Secondary | ICD-10-CM

## 2021-08-12 DIAGNOSIS — M6749 Ganglion, multiple sites: Secondary | ICD-10-CM

## 2021-08-12 DIAGNOSIS — M25562 Pain in left knee: Secondary | ICD-10-CM | POA: Diagnosis not present

## 2021-08-12 NOTE — Patient Instructions (Addendum)
[place] [phone] Call Today  When we receive your results we will contact you.  [facility] Support Keeping doing everything else See you again in 6-7 weeks

## 2021-08-12 NOTE — Assessment & Plan Note (Signed)
Patient had aspiration previously but within 1 month patient has had a reaccumulation again at this time.  I do feel that advanced imaging is warranted to see the size of this cyst and see if it is going to be properly treated appropriately with aspiration or if removal is necessary.  Patient does have intermittent pain secondary to it and we will need to continue to monitor.  Patient will follow-up after imaging we will discuss further.

## 2021-08-12 NOTE — Assessment & Plan Note (Signed)
Loss of transverse arch patient has made some improvement with the shoes.  Discussed over-the-counter orthotics that I think will also be beneficial.  Continue the home exercises.  Follow-up again in 6 to 8 weeks.

## 2021-08-19 ENCOUNTER — Ambulatory Visit
Admission: RE | Admit: 2021-08-19 | Discharge: 2021-08-19 | Disposition: A | Discharge status: Home / Self Care | Payer: BC Managed Care – PPO | Source: Ambulatory Visit | Attending: Family Medicine | Admitting: Family Medicine

## 2021-08-19 DIAGNOSIS — M25562 Pain in left knee: Secondary | ICD-10-CM | POA: Diagnosis not present

## 2021-08-23 ENCOUNTER — Encounter: Payer: Self-pay | Admitting: Family Medicine

## 2021-08-24 ENCOUNTER — Other Ambulatory Visit: Payer: Self-pay

## 2021-08-24 DIAGNOSIS — M25562 Pain in left knee: Secondary | ICD-10-CM

## 2021-08-26 ENCOUNTER — Encounter: Payer: Self-pay | Admitting: Family Medicine

## 2021-09-01 ENCOUNTER — Other Ambulatory Visit: Payer: Self-pay | Admitting: Obstetrics and Gynecology

## 2021-09-01 ENCOUNTER — Ambulatory Visit (INDEPENDENT_AMBULATORY_CARE_PROVIDER_SITE_OTHER): Payer: BC Managed Care – PPO | Admitting: Family Medicine

## 2021-09-01 ENCOUNTER — Other Ambulatory Visit: Payer: Self-pay

## 2021-09-01 ENCOUNTER — Encounter: Payer: Self-pay | Admitting: Family Medicine

## 2021-09-01 VITALS — BP 100/68 | HR 72 | Temp 98.4°F | Ht 65.5 in | Wt 114.3 lb

## 2021-09-01 DIAGNOSIS — J069 Acute upper respiratory infection, unspecified: Secondary | ICD-10-CM

## 2021-09-01 DIAGNOSIS — Z0000 Encounter for general adult medical examination without abnormal findings: Secondary | ICD-10-CM | POA: Diagnosis not present

## 2021-09-01 DIAGNOSIS — Z8639 Personal history of other endocrine, nutritional and metabolic disease: Secondary | ICD-10-CM

## 2021-09-01 DIAGNOSIS — Z131 Encounter for screening for diabetes mellitus: Secondary | ICD-10-CM | POA: Diagnosis not present

## 2021-09-01 DIAGNOSIS — Z1231 Encounter for screening mammogram for malignant neoplasm of breast: Secondary | ICD-10-CM

## 2021-09-01 DIAGNOSIS — Z1159 Encounter for screening for other viral diseases: Secondary | ICD-10-CM | POA: Diagnosis not present

## 2021-09-01 DIAGNOSIS — Z13220 Encounter for screening for lipoid disorders: Secondary | ICD-10-CM

## 2021-09-01 DIAGNOSIS — M818 Other osteoporosis without current pathological fracture: Secondary | ICD-10-CM

## 2021-09-01 LAB — CBC WITH DIFFERENTIAL/PLATELET
Basophils Absolute: 0.1 10*3/uL (ref 0.0–0.1)
Basophils Relative: 0.7 % (ref 0.0–3.0)
Eosinophils Absolute: 0.3 10*3/uL (ref 0.0–0.7)
Eosinophils Relative: 3.2 % (ref 0.0–5.0)
HCT: 41 % (ref 36.0–46.0)
Hemoglobin: 13.5 g/dL (ref 12.0–15.0)
Lymphocytes Relative: 18.8 % (ref 12.0–46.0)
Lymphs Abs: 1.8 10*3/uL (ref 0.7–4.0)
MCHC: 32.9 g/dL (ref 30.0–36.0)
MCV: 90.4 fl (ref 78.0–100.0)
Monocytes Absolute: 0.8 10*3/uL (ref 0.1–1.0)
Monocytes Relative: 7.6 % (ref 3.0–12.0)
Neutro Abs: 6.8 10*3/uL (ref 1.4–7.7)
Neutrophils Relative %: 69.7 % (ref 43.0–77.0)
Platelets: 260 10*3/uL (ref 150.0–400.0)
RBC: 4.54 Mil/uL (ref 3.87–5.11)
RDW: 13.7 % (ref 11.5–15.5)
WBC: 9.8 10*3/uL (ref 4.0–10.5)

## 2021-09-01 LAB — LIPID PANEL
Cholesterol: 178 mg/dL (ref 0–200)
HDL: 76.1 mg/dL (ref >39.00)
LDL Cholesterol: 89 mg/dL (ref 0–99)
NonHDL: 102.39
Total CHOL/HDL Ratio: 2
Triglycerides: 69 mg/dL (ref 0.0–149.0)
VLDL: 13.8 mg/dL (ref 0.0–40.0)

## 2021-09-01 LAB — T3, FREE: T3, Free: 2.8 pg/mL (ref 2.3–4.2)

## 2021-09-01 LAB — COMPREHENSIVE METABOLIC PANEL
ALT: 17 U/L (ref 0–35)
AST: 22 U/L (ref 0–37)
Albumin: 4.5 g/dL (ref 3.5–5.2)
Alkaline Phosphatase: 53 U/L (ref 39–117)
BUN: 10 mg/dL (ref 6–23)
CO2: 30 mEq/L (ref 19–32)
Calcium: 10.1 mg/dL (ref 8.4–10.5)
Chloride: 100 mEq/L (ref 96–112)
Creatinine, Ser: 0.84 mg/dL (ref 0.40–1.20)
GFR: 74.1 mL/min (ref >60.00)
Glucose, Bld: 96 mg/dL (ref 70–99)
Potassium: 4.2 mEq/L (ref 3.5–5.1)
Sodium: 139 mEq/L (ref 135–145)
Total Bilirubin: 0.4 mg/dL (ref 0.2–1.2)
Total Protein: 7.6 g/dL (ref 6.0–8.3)

## 2021-09-01 LAB — VITAMIN D 25 HYDROXY (VIT D DEFICIENCY, FRACTURES): VITD: 62.93 ng/mL (ref 30.00–100.00)

## 2021-09-01 LAB — TSH: TSH: 7.21 u[IU]/mL — ABNORMAL HIGH (ref 0.35–5.50)

## 2021-09-01 LAB — T4, FREE: Free T4: 0.92 ng/dL (ref 0.60–1.60)

## 2021-09-01 NOTE — Addendum Note (Signed)
Addended by: Amanda Cockayne on: 09/01/2021 10:17 AM   Modules accepted: Orders

## 2021-09-01 NOTE — Progress Notes (Signed)
Sophia [name] DOB: 23-May-1956 Encounter date: 09/01/2021  This is a 65 y.o. female who presents for complete physical   History of present illness/Additional concerns:  lexapro helping with sleep; not waking up early; getting more rest with this. Still doing well with this. Waking up lately with cough (see below). If wakes up falls back asleep easier.   *not on thyroid medication now; getting reclast injections through Dr. Renne Crigler. She will have follow up with her and recheck in May.    *completed pelvic floor therapy. Feels that pelvic floor therapy is helpful and feels that her constipation is related to bladder dysfunction. Plans to continue work with this.    *still going to osteo strong, running, lifting. Getting reclast and doc noted improvement in spine and wanted to give year break.   *had colonoscopy: repeat in 05/2026   Had bad sore throat a week ago. Did a televisit. Now has a little cough; somewhat productive. No fevers. Just feels like head cold now. Used to get recurrent sinus infections. Does have some sinus pressure. Drainage is making throat sore again. No shortness of breath or wheezing.   Bump on her knee was drained, then came back. Had imaging - and is going to get this surgically removed by Dr. Ninfa Linden.   Hypothyroid: has been off of medication since February. Feels fine.   Past Medical History:  Diagnosis Date   Hypothyroidism    Past Surgical History:  Procedure Laterality Date   BLADDER INSTILLATION     2007,2012,2018 botox   BREAST CYST EXCISION Bilateral 1985, 1995   both benign, 1 on each breast   BREAST SURGERY     1985-right, mid 1990s-left benign (fibrocystic breast disease)   CATARACT EXTRACTION Left 2013   EYE SURGERY Left    done for floaters   INGUINAL HERNIA REPAIR Left 1986   LASIK Bilateral    2000   Allergies  Allergen Reactions   Sulfa Antibiotics Hives   Current Meds  Medication Sig   amoxicillin (AMOXIL) 500 MG capsule  Take 1,000 mg by mouth daily.   Ascorbic Acid (VITAMIN C PO) Take by mouth as needed.   Cholecalciferol (VITAMIN D3 PO) Take 2,000 Units by mouth daily.   escitalopram (LEXAPRO) 10 MG tablet Take 1 tablet by mouth once daily   Homeopathic Products (ZICAM COLD REMEDY NA) by Other route in the morning and at bedtime.   MAGNESIUM PO Take 200 mg by mouth daily.   NIACINAMIDE PO Take 1,000 mg by mouth daily.   OVER THE COUNTER MEDICATION Immunity support 1000mg  daily   OVER THE COUNTER MEDICATION Turmeric/curcumin 1000mg  daily   OVER THE COUNTER MEDICATION Methyl B-12 1015mcg and Methyl Folate(plus P-5-P/B-6) 47mcg once a day   Probiotic Product (PROBIOTIC DAILY PO) Take by mouth.   Current Facility-Administered Medications for the 09/01/21 encounter (Office Visit) with Caren Macadam, MD  Medication   zoledronic acid (RECLAST) injection 5 mg   Social History   Tobacco Use   Smoking status: Never   Smokeless tobacco: Never  Substance Use Topics   Alcohol use: Yes    Comment: occasionally   Family History  Problem Relation Age of Onset   Arthritis Mother    Hypertension Mother    Hypertension Father    Prostate cancer Father 63   Basal cell carcinoma Father    Hypertension Sister    Colon polyps Sister    Diabetes Mellitus II Maternal Grandmother    Uterine cancer Maternal Grandmother        ?  not certain   Colon polyps Sister    Colon cancer Neg Hx    Esophageal cancer Neg Hx    Pancreatic cancer Neg Hx      Review of Systems  Constitutional:  Negative for activity change, appetite change, chills, fatigue, fever and unexpected weight change.  HENT:  Negative for congestion, ear pain, hearing loss, sinus pressure, sinus pain, sore throat and trouble swallowing.   Eyes:  Negative for pain and visual disturbance.  Respiratory:  Negative for cough, chest tightness, shortness of breath and wheezing.   Cardiovascular:  Negative for chest pain, palpitations and leg  swelling.  Gastrointestinal:  Negative for abdominal pain, blood in stool, constipation, diarrhea, nausea and vomiting.  Genitourinary:  Negative for difficulty urinating and menstrual problem.  Musculoskeletal:  Negative for arthralgias and back pain.  Skin:  Negative for rash.  Neurological:  Negative for dizziness, weakness, numbness and headaches.  Hematological:  Negative for adenopathy. Does not bruise/bleed easily.  Psychiatric/Behavioral:  Negative for sleep disturbance and suicidal ideas. The patient is not nervous/anxious.    CBC:  Lab Results  Component Value Date   WBC 5.3 08/31/2020   HGB 13.6 08/31/2020   HCT 41.6 08/31/2020   MCH 30.0 08/31/2020   MCHC 32.7 08/31/2020   RDW 12.3 08/31/2020   PLT 218 08/31/2020   MPV 11.9 08/31/2020   CMP: Lab Results  Component Value Date   NA 141 08/31/2020   NA 143 08/07/2019   K 4.1 08/31/2020   CL 104 08/31/2020   CO2 31 08/31/2020   GLUCOSE 92 08/31/2020   BUN 14 08/31/2020   BUN 11 08/07/2019   CREATININE 0.87 08/31/2020   GFRAA >60 08/07/2019   CALCIUM 9.8 08/31/2020   PROT 7.1 08/31/2020   BILITOT 0.4 08/31/2020   ALKPHOS 42 08/07/2019   ALT 19 08/31/2020   AST 19 08/31/2020   LIPID: Lab Results  Component Value Date   CHOL 177 08/31/2020   TRIG 52 08/31/2020   HDL 72 08/31/2020   LDLCALC 92 08/31/2020    Objective:  BP 100/68 (BP Location: Left Arm, Patient Position: Sitting, Cuff Size: Normal)   Pulse 72   Temp 98.4 F (36.9 C) (Oral)   Ht 5' 5.5" (1.664 m)   Wt 114 lb 4.8 oz (51.8 kg)   SpO2 98%   BMI 18.73 kg/m   Weight: 114 lb 4.8 oz (51.8 kg)   BP Readings from Last 3 Encounters:  09/01/21 100/68  08/12/21 (!) 96/54  07/01/21 102/64   Wt Readings from Last 3 Encounters:  09/01/21 114 lb 4.8 oz (51.8 kg)  08/12/21 112 lb (50.8 kg)  07/01/21 111 lb (50.3 kg)    Physical Exam Constitutional:      General: She is not in acute distress.    Appearance: She is well-developed.  HENT:      Head: Normocephalic and atraumatic.     Right Ear: External ear normal.     Left Ear: External ear normal.     Mouth/Throat:     Pharynx: No oropharyngeal exudate.  Eyes:     Conjunctiva/sclera: Conjunctivae normal.     Pupils: Pupils are equal, round, and reactive to light.  Neck:     Thyroid: No thyromegaly.  Cardiovascular:     Rate and Rhythm: Normal rate and regular rhythm.     Heart sounds: Normal heart sounds. No murmur heard.   No friction rub. No gallop.  Pulmonary:     Effort: Pulmonary effort  is normal.     Breath sounds: Normal breath sounds.  Abdominal:     General: Bowel sounds are normal. There is no distension.     Palpations: Abdomen is soft. There is no mass.     Tenderness: There is no abdominal tenderness. There is no guarding.     Hernia: No hernia is present.  Musculoskeletal:        General: No tenderness or deformity. Normal range of motion.     Cervical back: Normal range of motion and neck supple.  Lymphadenopathy:     Cervical: No cervical adenopathy.  Skin:    General: Skin is warm and dry.     Findings: No rash.  Neurological:     Mental Status: She is alert and oriented to person, place, and time.     Deep Tendon Reflexes: Reflexes normal.     Reflex Scores:      Tricep reflexes are 2+ on the right side and 2+ on the left side.      Bicep reflexes are 2+ on the right side and 2+ on the left side.      Brachioradialis reflexes are 2+ on the right side and 2+ on the left side.      Patellar reflexes are 2+ on the right side and 2+ on the left side. Psychiatric:        Speech: Speech normal.        Behavior: Behavior normal.        Thought Content: Thought content normal.    Assessment/Plan: Health Maintenance Due  Topic Date Due   Hepatitis C Screening  Never done   COVID-19 Vaccine (5 - Booster for Pfizer series) 07/06/2021   Health Maintenance reviewed.  1. Preventative health care Keep up with exercise and healthy eating.  -  Hepatitis C antibody; Future  2. History of hypothyroidism Has been off medications. Does follow with endo. Adding thyroid studies to bloodwork today. - CBC with Differential/Platelet; Future - TSH; Future - T4, free; Future - T3, free; Future  3. Other osteoporosis without current pathological fracture Managed through endo. Recheck vitamin d today. - VITAMIN D 25 Hydroxy (Vit-D Deficiency, Fractures); Future  4. Screening for diabetes mellitus - Comprehensive metabolic panel; Future  5. Lipid screening - Lipid panel; Future  6. Upper respiratory tract infection, unspecified type She is completing antibiotics for initial dx of strep through televisit. If not improved with sx by Friday let me know. Sx have evolved to more congestion and post nasal drainage. If not improving would consider abx treatment for better sinus coverage.   Return in about 1 year (around 09/01/2022) for physical exam.  Micheline Rough, MD

## 2021-09-02 ENCOUNTER — Other Ambulatory Visit: Payer: Self-pay | Admitting: Internal Medicine

## 2021-09-02 ENCOUNTER — Encounter: Payer: Self-pay | Admitting: Internal Medicine

## 2021-09-02 DIAGNOSIS — M810 Age-related osteoporosis without current pathological fracture: Secondary | ICD-10-CM

## 2021-09-02 LAB — HEPATITIS C ANTIBODY
Hepatitis C Ab: NONREACTIVE
SIGNAL TO CUT-OFF: 0.04 (ref <1.00)

## 2021-09-03 ENCOUNTER — Other Ambulatory Visit: Payer: Self-pay | Admitting: Internal Medicine

## 2021-09-03 DIAGNOSIS — Z8639 Personal history of other endocrine, nutritional and metabolic disease: Secondary | ICD-10-CM

## 2021-09-03 MED ORDER — BENZONATATE 100 MG PO CAPS: 100.0000 mg | ORAL_CAPSULE | Freq: Three times a day (TID) | ORAL | 0 refills | Status: DC | PRN

## 2021-09-07 ENCOUNTER — Ambulatory Visit: Payer: BC Managed Care – PPO | Admitting: Orthopaedic Surgery

## 2021-09-08 ENCOUNTER — Encounter: Payer: Self-pay | Admitting: Family Medicine

## 2021-09-10 ENCOUNTER — Other Ambulatory Visit: Payer: Self-pay

## 2021-09-10 ENCOUNTER — Encounter: Payer: Self-pay | Admitting: Family Medicine

## 2021-09-10 ENCOUNTER — Ambulatory Visit (INDEPENDENT_AMBULATORY_CARE_PROVIDER_SITE_OTHER): Payer: BC Managed Care – PPO | Admitting: Family Medicine

## 2021-09-10 VITALS — BP 90/62 | HR 70 | Temp 98.0°F | Wt 117.2 lb

## 2021-09-10 DIAGNOSIS — R0982 Postnasal drip: Secondary | ICD-10-CM

## 2021-09-10 DIAGNOSIS — J029 Acute pharyngitis, unspecified: Secondary | ICD-10-CM | POA: Diagnosis not present

## 2021-09-10 DIAGNOSIS — H6122 Impacted cerumen, left ear: Secondary | ICD-10-CM | POA: Diagnosis not present

## 2021-09-10 MED ORDER — FLUTICASONE PROPIONATE 50 MCG/ACT NA SUSP: 1.0000 | Freq: Every day | NASAL | 0 refills | Status: DC

## 2021-09-10 MED ORDER — CETIRIZINE HCL 10 MG PO TABS: 10.0000 mg | ORAL_TABLET | Freq: Every day | ORAL | 1 refills | Status: DC

## 2021-09-10 NOTE — Progress Notes (Signed)
Subjective:    Patient ID: GLAYDS INSCO, female    DOB: [birth year], 65 y.o.   MRN: [id number]  Chief Complaint  Patient presents with   Sore Throat    3 weeks, cough has calmed down but throat is still sore.     HPI Patient was seen today for ongoing symptoms.  Pt with sore throat x 3 wks.  Seen via telehealth, given amoxicillin.  Seen 10/12 by pcp, given tessalon which helped.  Patient notes sore throat has returned.  Tried OTC Robitussin along with prescribed medications.  Patient denies headache, fever, facial pain/pressure, ear pain/pressure, nausea, vomiting, wheezing.  Sick contacts include patient's grandchildren.  Patient denies history of seasonal allergies.  Of note patient is having her bathroom remodeled.  Endorses seeing new layers of dust on items in her home daily.  Past Medical History:  Diagnosis Date   Hypothyroidism     Allergies  Allergen Reactions   Sulfa Antibiotics Hives    ROS General: Denies fever, chills, night sweats, changes in weight, changes in appetite HEENT: Denies headaches, ear pain, changes in vision, rhinorrhea  +sore throat CV: Denies CP, palpitations, SOB, orthopnea Pulm: Denies SOB, wheezing  + cough GI: Denies abdominal pain, nausea, vomiting, diarrhea, constipation GU: Denies dysuria, hematuria, frequency, vaginal discharge Msk: Denies muscle cramps, joint pains Neuro: Denies weakness, numbness, tingling Skin: Denies rashes, bruising Psych: Denies depression, anxiety, hallucinations     Objective:    Blood pressure 90/62, pulse 70, temperature 98 F (36.7 C), temperature source Oral, weight 117 lb 3.2 oz (53.2 kg), SpO2 99 %.   Gen. Pleasant, well-nourished, in no distress, normal affect   HEENT: [place]/AT, face symmetric, conjunctiva clear, no scleral icterus, PERRLA, EOMI, nares patent without drainage, pharynx with clear postnasal drainage and mild erythema, no exudate.  Right canal occluded with cerumen.  Partially visualized  left TM normal.  Right TM full without erythema. Lungs: no accessory muscle use, CTAB, no wheezes or rales Cardiovascular: RRR, no m/r/g, no peripheral edema Neuro:  A&Ox3, CN II-XII intact, normal gait Skin:  Warm, no lesions/ rash   Wt Readings from Last 3 Encounters:  09/10/21 117 lb 3.2 oz (53.2 kg)  09/01/21 114 lb 4.8 oz (51.8 kg)  08/12/21 112 lb (50.8 kg)    Lab Results  Component Value Date   WBC 9.8 09/01/2021   HGB 13.5 09/01/2021   HCT 41.0 09/01/2021   PLT 260.0 09/01/2021   GLUCOSE 96 09/01/2021   CHOL 178 09/01/2021   TRIG 69.0 09/01/2021   HDL 76.10 09/01/2021   LDLCALC 89 09/01/2021   ALT 17 09/01/2021   AST 22 09/01/2021   NA 139 09/01/2021   K 4.2 09/01/2021   CL 100 09/01/2021   CREATININE 0.84 09/01/2021   BUN 10 09/01/2021   CO2 30 09/01/2021   TSH 7.21 (H) 09/01/2021    Assessment/Plan:  Post-nasal drainage  -Likely 2/2 seasonal and environmental allergies -Discussed starting antihistamine such as Claritin, Zyrtec, Allegra -Also discussed using a nasal spray or saline nasal rinse - Plan: fluticasone (FLONASE) 50 MCG/ACT nasal spray, cetirizine (ZYRTEC) 10 MG tablet  Sore throat -Initially symptoms 2/2 viral etiology, now likely 2/2 postnasal drainage. -Discussed starting antihistamine -Advised on supportive care including gargling with warm salt water or Chloraseptic spray and drinking warm fluids with lemon/honey.  Impacted cerumen of left ear -OTC Debrox eardrops  F/u as needed  Grier Mitts, MD

## 2021-09-21 ENCOUNTER — Encounter: Payer: Self-pay | Admitting: Orthopaedic Surgery

## 2021-09-21 ENCOUNTER — Ambulatory Visit (INDEPENDENT_AMBULATORY_CARE_PROVIDER_SITE_OTHER): Payer: BC Managed Care – PPO | Admitting: Orthopaedic Surgery

## 2021-09-21 DIAGNOSIS — M67462 Ganglion, left knee: Secondary | ICD-10-CM

## 2021-09-21 NOTE — Progress Notes (Signed)
Office Visit Note   Patient: Sophia [name]           Date of Birth: [birth year]           MRN: [id number] Visit Date: 09/21/2021              Requested by: Lyndal Pulley, DO [place],  [place] [address] PCP: Caren Macadam, MD   Assessment & Plan: Visit Diagnoses:  1. Ganglion of left knee     Plan: I talked with the patient in length about the anatomy of the knee and explained what is going on with the cyst itself.  I agree with her having this excised at this standpoint given the fact that she had a recurrence right away even after an aspiration and steroid injection.  I explained in detail what the surgery involves.  I described the risk and benefits of surgery and what to expect from an intraoperative and postoperative course.  She would like to have this done sometime before the end of the year and I agree with this as well.  We will be in touch about scheduling surgery then see her back at 2 weeks postoperative.  All questions and concerns were answered and addressed.  Follow-Up Instructions: Return for 2 weeks post-op.   Orders:  No orders of the defined types were placed in this encounter.  No orders of the defined types were placed in this encounter.     Procedures: No procedures performed   Clinical Data: No additional findings.   Subjective: Chief Complaint  Patient presents with   Left Knee - Cyst  Patient is a very pleasant and active 65 year old female who is referred from Dr. Hulan Saas to evaluate and treat a ganglion cyst along the lateral aspect of the patient's left knee.  She has had this aspirated before under ultrasound guidance and a steroid injection placed in the cyst area.  This was done by Dr. Tamala Julian.  The cyst did recur.  She has an MRI that accompanies her today as well as plain films on the canopy system of her left knee.  The cyst is not painful to her.  She is an avid walker for exercise.  She is a thin  individual and is not a diabetic and does not have any active acute medical issues.  She denies any pain with the knee joint or swelling with the knee joint or instability with the left knee.  HPI  Review of Systems There is currently listed no headache, chest pain, shortness of breath, fever, chills, nausea, vomiting  Objective: Vital Signs: There were no vitals taken for this visit.  Physical Exam She is alert and orient x3 and in no acute distress Ortho Exam Examination of her left knee shows a palpable mass along the lateral aspect of the knee between the fibula head area and the knee joint laterally.  Her knee exam is otherwise normal with full range of motion and no effusion.  The knee is ligamentously stable. Specialty Comments:  No specialty comments available.  Imaging: No results found. The MRI is reviewed and there is a multiloculated ganglion cyst that is likely a parameniscal type of cyst off the lateral aspect of the knee.  There is no meniscal tear of the knee and there is some tricompartment arthritic changes that are not severe.  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 not certain   Colon polyps Sister    Colon cancer Neg Hx    Esophageal cancer Neg Hx    Pancreatic cancer Neg Hx     Past Surgical History:   Procedure Laterality Date   BLADDER INSTILLATION     2007,2012,2018 botox   BREAST CYST EXCISION Bilateral 1985, 1995   both benign, 1 on each breast   BREAST SURGERY     1985-right, mid 1990s-left benign (fibrocystic breast disease)   CATARACT EXTRACTION Left 2013   EYE SURGERY Left    done for floaters   INGUINAL HERNIA REPAIR Left 1986   LASIK Bilateral    2000   Social History   Occupational History   Not on file  Tobacco Use   Smoking status: Never   Smokeless tobacco: Never  Vaping Use   Vaping Use: Never used  Substance and Sexual Activity   Alcohol use: Yes    Comment: occasionally   Drug use: Not on file   Sexual activity: Not on file

## 2021-09-22 ENCOUNTER — Ambulatory Visit: Payer: BC Managed Care – PPO | Admitting: Podiatry

## 2021-09-23 ENCOUNTER — Ambulatory Visit: Payer: BC Managed Care – PPO | Admitting: Family Medicine

## 2021-09-23 DIAGNOSIS — L821 Other seborrheic keratosis: Secondary | ICD-10-CM | POA: Diagnosis not present

## 2021-09-23 DIAGNOSIS — L814 Other melanin hyperpigmentation: Secondary | ICD-10-CM | POA: Diagnosis not present

## 2021-09-23 DIAGNOSIS — D225 Melanocytic nevi of trunk: Secondary | ICD-10-CM | POA: Diagnosis not present

## 2021-09-23 DIAGNOSIS — L570 Actinic keratosis: Secondary | ICD-10-CM | POA: Diagnosis not present

## 2021-09-25 ENCOUNTER — Encounter: Payer: Self-pay | Admitting: Orthopaedic Surgery

## 2021-10-04 ENCOUNTER — Ambulatory Visit (INDEPENDENT_AMBULATORY_CARE_PROVIDER_SITE_OTHER): Payer: BC Managed Care – PPO | Admitting: Podiatry

## 2021-10-04 ENCOUNTER — Encounter: Payer: Self-pay | Admitting: Podiatry

## 2021-10-04 ENCOUNTER — Other Ambulatory Visit: Payer: Self-pay

## 2021-10-04 DIAGNOSIS — M205X1 Other deformities of toe(s) (acquired), right foot: Secondary | ICD-10-CM | POA: Diagnosis not present

## 2021-10-04 DIAGNOSIS — M205X2 Other deformities of toe(s) (acquired), left foot: Secondary | ICD-10-CM | POA: Diagnosis not present

## 2021-10-04 DIAGNOSIS — L84 Corns and callosities: Secondary | ICD-10-CM | POA: Diagnosis not present

## 2021-10-04 NOTE — Progress Notes (Signed)
  Subjective:  Patient ID: Sophia [name], female    DOB: 03/05/1956,   MRN: [id number]  Chief Complaint  Patient presents with   Callouses    I have some calluses on both feet    Foot Pain    I saw Dr Doran Durand at [facility] ortho and wanted to do surgery in the fall on the bunion and they really do not hurt and I had x-rays about 6 months ago    65 y.o. female presents for concern of calluses on both feet and bunions. Relates she has had lots of foot problems in the past and had orthotics and tried all sorts of things. Here today for concern of bilateral great toe joint discomfort and concern of calluses. Was seen by Dr. Doran Durand and was scheduled to have bunion surgery but denies any pain currently. Relates occasionally when she moves her great toe joints they will hurt.  . Denies any other pedal complaints. Denies n/v/f/c.   Past Medical History:  Diagnosis Date   Hypothyroidism     Objective:  Physical Exam: Vascular: DP/PT pulses 2/4 bilateral. CFT <3 seconds. Normal hair growth on digits. No edema.  Skin. No lacerations or abrasions bilateral feet. Hyperkeratotic tissue noted to medial right hallux and sub second distal digit.  Musculoskeletal: MMT 5/5 bilateral lower extremities in DF, PF, Inversion and Eversion. Deceased ROM in DF of ankle joint. No tenderness to palpation of first MTPJ. Pain with end ROM of the joint bilaterally. Left worse than right.  Neurological: Sensation intact to light touch.   Assessment:   1. Hallux limitus, left   2. Hallux limitus, right   3. Calluse      Plan:  Patient was evaluated and treated and all questions answered. -Xrays unable to review. Patient relates she will bring her X-rays next time she comes in.  -Discussed hallux limitus and  treatement options; conservative and  Surgical management; risks, benefits, alternatives discussed. All patient's questions answered. -Recommended use of voltaren gel.  -Recommend continue with good  supportive shoes and inserts. Discussed stiff soled shoes and use of carbon fiber foot plate.   Hyperkeratotic areas trimmed with blade without incident as courtesy. Padding dispensed.  -Patient to return to office as needed or sooner if condition worsens.   Lorenda Peck, DPM

## 2021-10-14 ENCOUNTER — Encounter: Payer: Self-pay | Admitting: Orthopaedic Surgery

## 2021-10-16 IMAGING — MR MR KNEE*L* W/O CM
4 of 7 series · 22 of 40 positions shown · non-contrast
Comparison: None.

CLINICAL DATA: Chronic left knee pain. Pain along the lateral
aspect for 2 months.

EXAM:
MRI OF THE LEFT KNEE WITHOUT CONTRAST
TECHNIQUE: Multiplanar, multisequence MR imaging of the knee was performed. No
intravenous contrast was administered.

[Series 4: T2 fat-sat · coronal · 4.0mm · 0.59mm/px · 5 of 20 slices shown (1 of 2)]
[im 1/20]
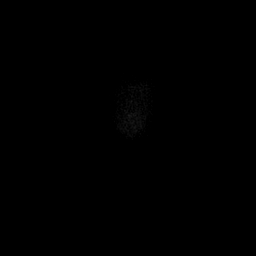
[im 5/20]
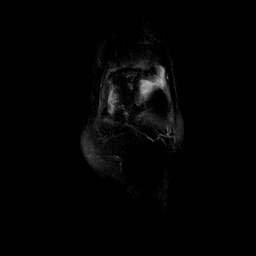
[im 10/20]
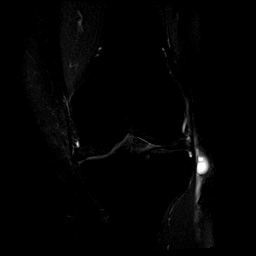
[im 15/20]
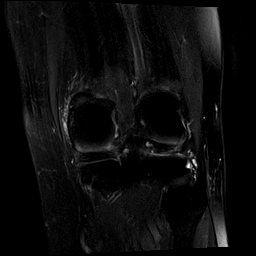
[im 20/20]
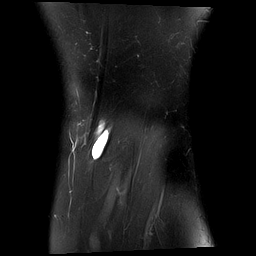

[Series 5: T1 · coronal · 4.0mm · 0.29mm/px · 3 of 20 slices shown]
[im 1/20]
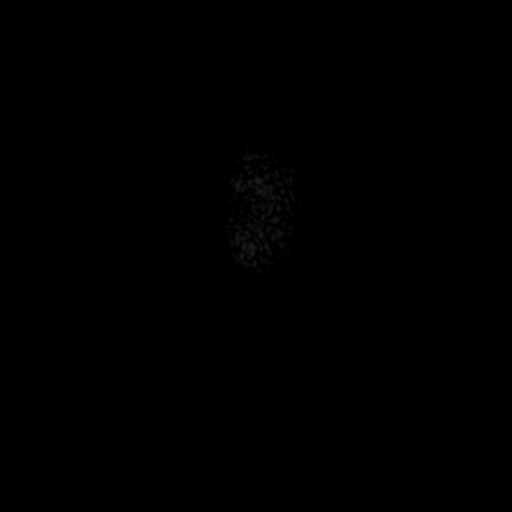
[im 10/20]
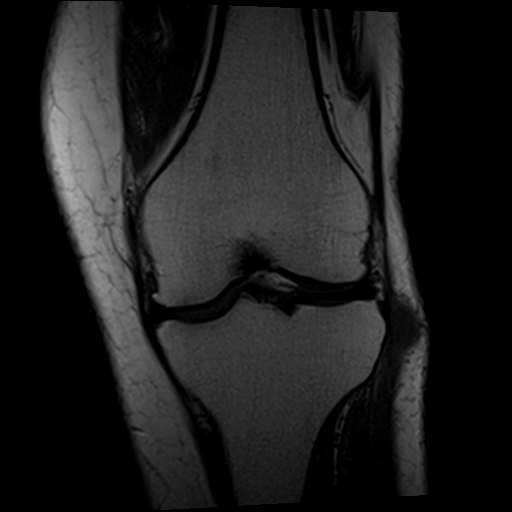
[im 20/20]
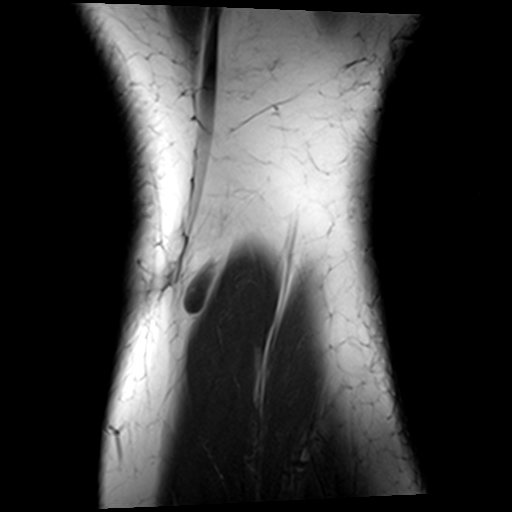

[Series 7: T2 fat-sat · sagittal · 3.0mm · 0.29mm/px · 7 of 27 slices shown (2 of 2)]
[im 1/27]
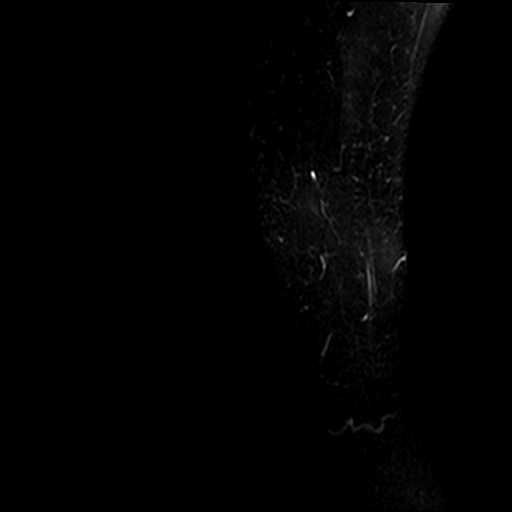
[im 5/27]
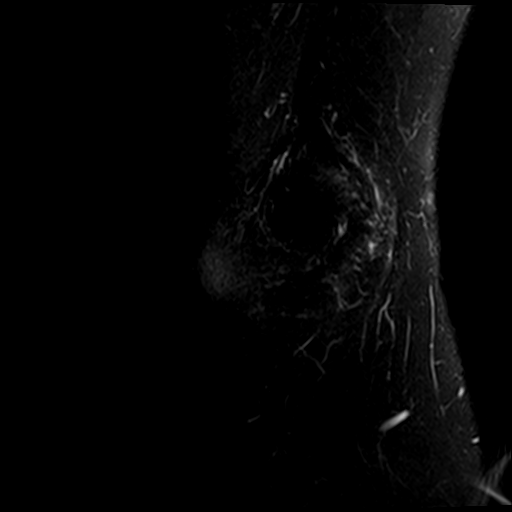
[im 9/27]
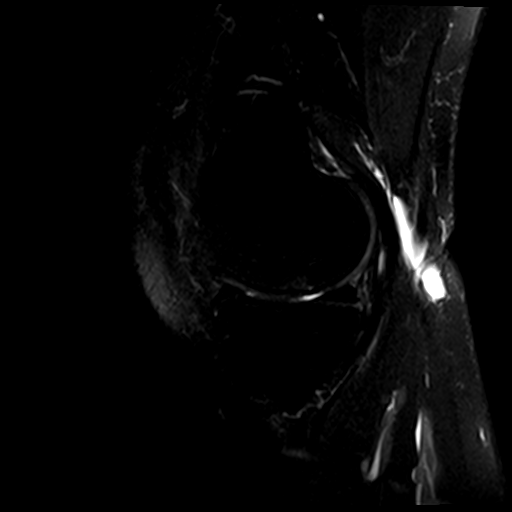
[im 14/27]
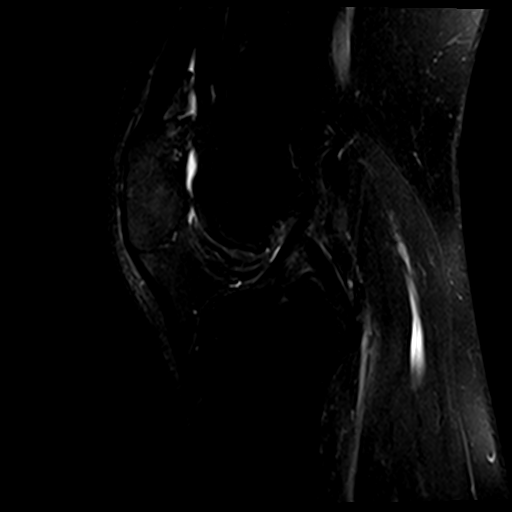
[im 18/27]
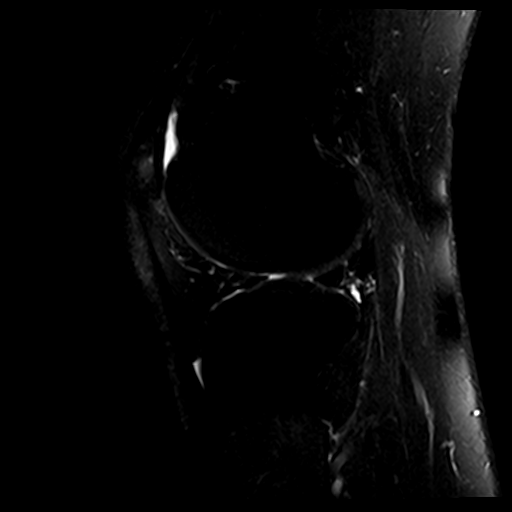
[im 22/27]
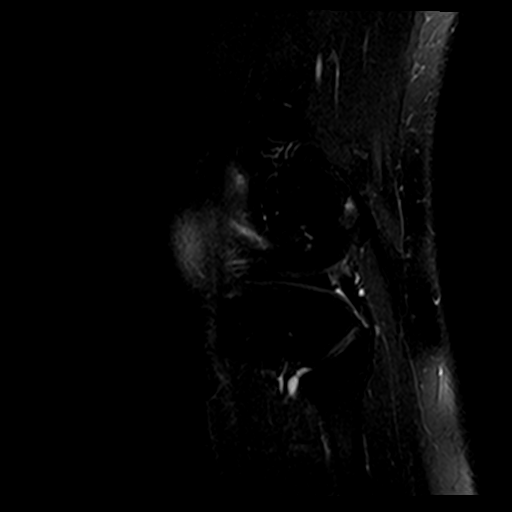
[im 27/27]
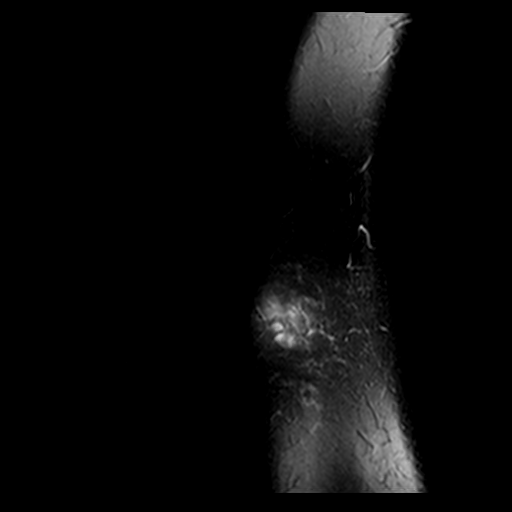

[Series 8: PD fat-sat · sagittal · 3.0mm · 0.29mm/px · 7 of 27 slices shown]
[im 1/27]
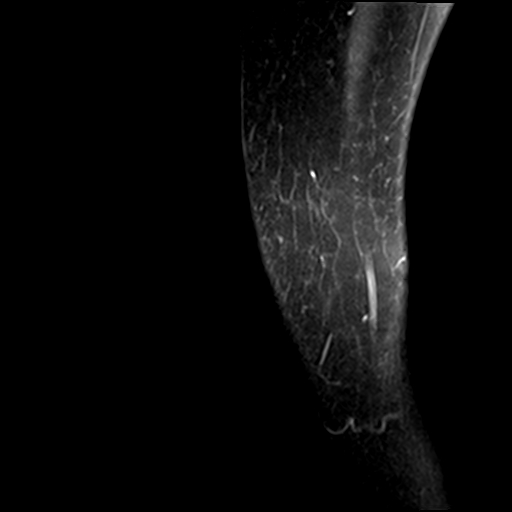
[im 5/27]
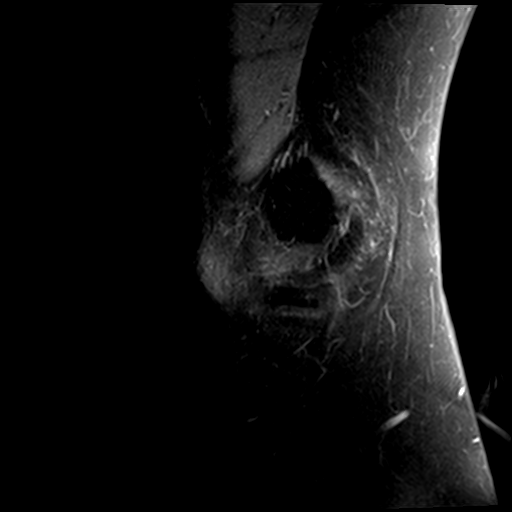
[im 9/27]
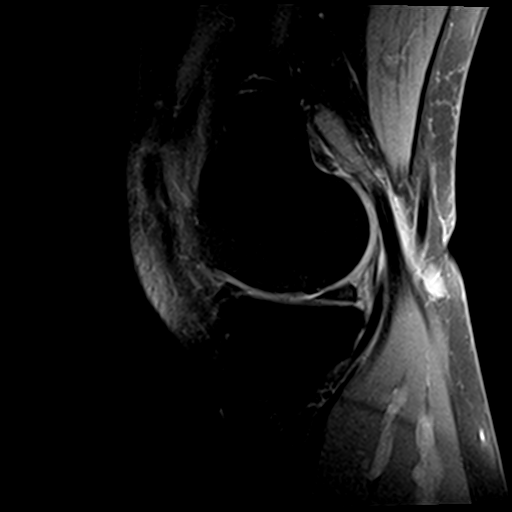
[im 14/27]
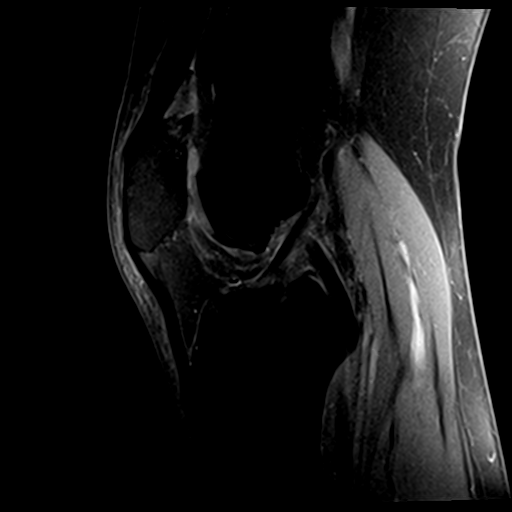
[im 18/27]
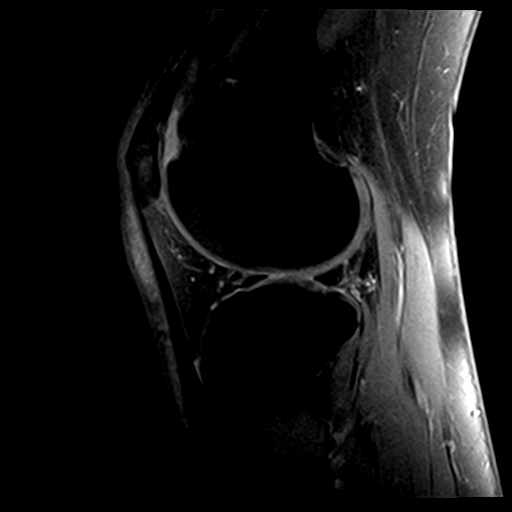
[im 22/27]
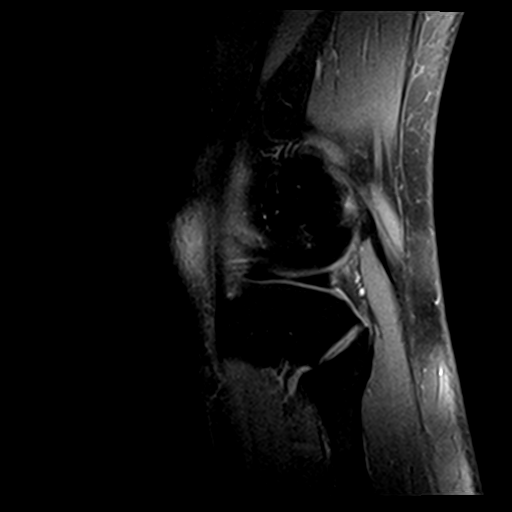
[im 27/27]
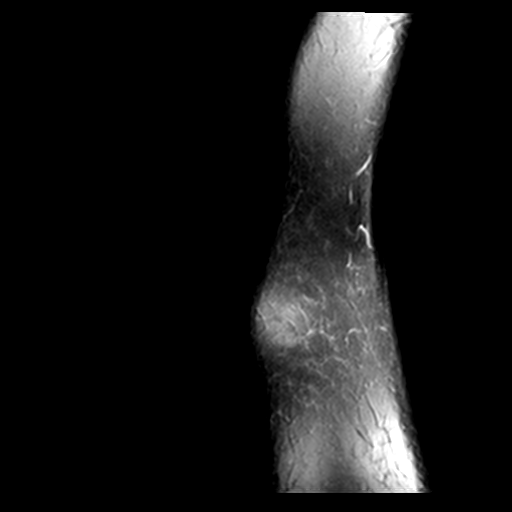

[22 of 40 positions shown; findings below may reference images not displayed]

FINDINGS: MENISCI

Medial: Degeneration of the posterior horn of the medial meniscus.
No discrete medial meniscal tear.

Lateral: Intact.

LIGAMENTS

Cruciates: ACL and PCL are intact.

Collaterals: Medial collateral ligament is intact. Lateral
collateral ligament complex is intact.

CARTILAGE

Patellofemoral: Full-thickness cartilage loss of the patella with
subchondral reactive marrow changes in the lateral patellar facet.
Partial-thickness cartilage loss of the trochlea

Medial: Partial-thickness cartilage loss of the medial femorotibial
compartment with areas of high-grade partial-thickness cartilage
loss of the medial femoral condyle and subchondral reactive marrow
edema.

Lateral: Partial-thickness cartilage loss of the lateral
femorotibial compartment.

JOINT: No joint effusion. Normal Bubbup Odnalro. No plical
thickening.

POPLITEAL FOSSA: Popliteus tendon is intact. Small Baker's cyst.

EXTENSOR MECHANISM: Intact quadriceps tendon. Intact patellar
tendon. Intact lateral patellar retinaculum. Intact medial patellar
retinaculum. Intact MPFL.

BONES: No aggressive osseous lesion. No fracture or dislocation.

Other: No fluid collection or hematoma. Muscles are normal. 2.3 x 1
x 2.5 cm cystic mass along the lateral aspect of the proximal tibia
between the iliotibial band and biceps femoris tendon likely
reflecting a ganglion cyst.
IMPRESSION: 1.  Tricompartmental cartilage abnormalities as described above.
2. No meniscal or ligamentous injury of the left knee.
3. 2.3 x 1 x 2.5 cm cystic mass along the lateral aspect of the
proximal tibia between the iliotibial band and biceps femoris tendon
likely reflecting a ganglion cyst.

## 2021-10-18 DIAGNOSIS — H52213 Irregular astigmatism, bilateral: Secondary | ICD-10-CM | POA: Diagnosis not present

## 2021-10-18 DIAGNOSIS — Z961 Presence of intraocular lens: Secondary | ICD-10-CM | POA: Diagnosis not present

## 2021-10-21 ENCOUNTER — Other Ambulatory Visit: Payer: Self-pay | Admitting: Orthopaedic Surgery

## 2021-10-21 DIAGNOSIS — M67462 Ganglion, left knee: Secondary | ICD-10-CM | POA: Diagnosis not present

## 2021-10-21 MED ORDER — ONDANSETRON 4 MG PO TBDP
4.0000 mg | ORAL_TABLET | Freq: Three times a day (TID) | ORAL | 0 refills | Status: DC | PRN
Start: 2021-10-21 — End: 2022-06-14

## 2021-10-21 MED ORDER — HYDROCODONE-ACETAMINOPHEN 5-325 MG PO TABS: 1.0000 | ORAL_TABLET | Freq: Four times a day (QID) | ORAL | 0 refills | Status: DC | PRN

## 2021-10-23 ENCOUNTER — Other Ambulatory Visit: Payer: Self-pay | Admitting: Family Medicine

## 2021-10-25 ENCOUNTER — Other Ambulatory Visit: Payer: BC Managed Care – PPO

## 2021-10-27 ENCOUNTER — Other Ambulatory Visit (INDEPENDENT_AMBULATORY_CARE_PROVIDER_SITE_OTHER): Payer: BC Managed Care – PPO

## 2021-10-27 ENCOUNTER — Other Ambulatory Visit: Payer: Self-pay

## 2021-10-27 DIAGNOSIS — Z8639 Personal history of other endocrine, nutritional and metabolic disease: Secondary | ICD-10-CM | POA: Diagnosis not present

## 2021-10-27 LAB — T4, FREE: Free T4: 0.8 ng/dL (ref 0.60–1.60)

## 2021-10-27 LAB — T3, FREE: T3, Free: 2.8 pg/mL (ref 2.3–4.2)

## 2021-10-27 LAB — TSH: TSH: 5.13 u[IU]/mL (ref 0.35–5.50)

## 2021-11-04 ENCOUNTER — Ambulatory Visit (INDEPENDENT_AMBULATORY_CARE_PROVIDER_SITE_OTHER): Payer: BC Managed Care – PPO | Admitting: Orthopaedic Surgery

## 2021-11-04 ENCOUNTER — Encounter: Payer: Self-pay | Admitting: Orthopaedic Surgery

## 2021-11-04 ENCOUNTER — Other Ambulatory Visit: Payer: Self-pay

## 2021-11-04 DIAGNOSIS — M67462 Ganglion, left knee: Secondary | ICD-10-CM

## 2021-11-04 DIAGNOSIS — Z9889 Other specified postprocedural states: Secondary | ICD-10-CM

## 2021-11-04 NOTE — Progress Notes (Signed)
The patient is 2 weeks status post excision of a benign ganglion cyst to the lateral aspect of her knee.  She is a very active 65 years old.  She says she is doing well.  Procedure lumbar states remove the sutures in place Steri-Strips.  She understands this is a benign cyst and I feels like we did get the area closed off.  She will continue to increase her activity as he tolerates except for high impact aerobic activities.  I want her to at least take 4 more weeks off from running or other exercise equipment.  All questions and concerns were answered and addressed.  Follow-up is as needed.  She knows to let the Steri-Strips fall off on their own.  If they have been on for over 2 to 3 weeks she can peel them off.

## 2021-12-07 ENCOUNTER — Other Ambulatory Visit: Payer: Self-pay | Admitting: Family Medicine

## 2021-12-07 DIAGNOSIS — R0982 Postnasal drip: Secondary | ICD-10-CM

## 2022-01-07 ENCOUNTER — Ambulatory Visit (INDEPENDENT_AMBULATORY_CARE_PROVIDER_SITE_OTHER): Payer: BC Managed Care – PPO | Admitting: Internal Medicine

## 2022-01-07 ENCOUNTER — Other Ambulatory Visit: Payer: Self-pay

## 2022-01-07 ENCOUNTER — Encounter: Payer: Self-pay | Admitting: Internal Medicine

## 2022-01-07 VITALS — BP 110/72 | HR 71 | Ht 65.5 in | Wt 117.0 lb

## 2022-01-07 DIAGNOSIS — Z8639 Personal history of other endocrine, nutritional and metabolic disease: Secondary | ICD-10-CM

## 2022-01-07 DIAGNOSIS — E673 Hypervitaminosis D: Secondary | ICD-10-CM | POA: Diagnosis not present

## 2022-01-07 DIAGNOSIS — M810 Age-related osteoporosis without current pathological fracture: Secondary | ICD-10-CM | POA: Diagnosis not present

## 2022-01-07 LAB — TSH: TSH: 5.41 u[IU]/mL (ref 0.35–5.50)

## 2022-01-07 LAB — T4, FREE: Free T4: 0.71 ng/dL (ref 0.60–1.60)

## 2022-01-07 LAB — T3, FREE: T3, Free: 2.5 pg/mL (ref 2.3–4.2)

## 2022-01-07 NOTE — Progress Notes (Signed)
Patient ID: Sophia [name], female   DOB: Apr 16, 1956, 66 y.o.   MRN: [id number]   This visit occurred during the SARS-CoV-2 public health emergency.  Safety protocols were in place, including screening questions prior to the visit, additional usage of staff PPE, and extensive cleaning of exam room while observing appropriate contact time as indicated for disinfecting solutions.   HPI  Sophia [name] is a 66 y.o.-year-old female, initially referred by Dr. Marvel Plan, returning for follow-up for osteoporosis, also history of hypothyroidism and high vitamin D. She lived in [place] and then in [place] in 07/2019.  She established care with [facility] in 02/2020.  Last visit 1 year ago.  Interim history: No fractures since last visit. No orthostasis, dizziness, vertigo, vision problems. She is walking/running 4-5 miles a day.  She was diagnosed with osteoporosis in 2018.  Reviewed latest DXA scans: Date L1-L3 (L4) T score FN T score 33% distal Radius  [facility] -2.2 RFN: -1.7 LFN: -1.6 N/a  [facility]10[facility]2019 [facility] -2.5 RFN: -1.6 LFN: -1.5 n/a  10/10/2018 -2.2 RFN: -1.5 LFN: -1.7 n/a  10/09/2017 -2.6 RFN: -1.5 LFN:-1.8 n/a   No fractures. She did have 3 falls since moving to [place] (tripping), but she attributes this to a particular pair of shoes.  Previous osteoporosis treatments: - Actonel - for 5 years, until 2018 - Reclast 10/2017, 10/2018, 10/06/2020  No history of vitamin D deficiency.  In fact, at last visit, vitamin D was high: Lab Results  Component Value Date   VD25OH 62.93 09/01/2021   VD25OH 76 08/31/2020   VD25OH 80.9 03/31/2020   VD25OH 100.64 ([facility]) 03/30/2020   VD25OH 104.70 ([facility]) 01/29/2020  07/2019: vitamin D 89 She had problems with her insurance paying for her vitamin D levels.  Pt is on: - vitamin D 2000 IU + MVI >> after last visit, decreased to 1000 units +  + osteobiflex  - vitamin K2 + 400 IU vit D (total: ~2000  units)  She drinks almond milk, eats cheese, yogurt. She does weightbearing exercises.  She also runs 2 miles a day and walks tomorrow with her dog Burnett Corrente). Since continues [facility] skeletal loading-started 02/2020 - once a week.  She does not take high vitamin A doses.  Menopause was at 66 y/o.  No history of HRT.  FH of osteoporosis: mother.  No hyper or hypocalcemia, hyperparathyroidism.  No history of kidney stones. Lab Results  Component Value Date   CALCIUM 10.1 09/01/2021   CALCIUM 9.8 08/31/2020   CALCIUM 9.8 01/29/2020   CALCIUM 9.6 08/07/2019  07/2019: Ca 9.6  She has a history of hypothyroidism, previously on levothyroxine 50 mcg daily since age 45 >> then decreased to 25 mcg, and now off since 10/2020 (we stopped it to see if she actually needed it).    Latest TFTs: Lab Results  Component Value Date   TSH 5.13 10/27/2021   TSH 7.21 ([facility]) 09/01/2021   TSH 3.82 04/07/2021   TSH 3.13 01/01/2021   TSH 2.91 08/31/2020  07/2019: TSH 0.48  No CKD. Last BUN/Cr: Lab Results  Component Value Date   BUN 10 09/01/2021   CREATININE 0.84 09/01/2021  07/2019: BUN/Cr: 11/0.88, GFR >60  Diet: - Coffee with creamer - Breakfast: Oatmeal with almonds, flax/Chia seeds or coconut yogurt - Lunch: Skips - Midafternoon snack: Nuts, cheese, popcorn - Dinner: Patient/poultry with vegetables/greens, smoothie with almond milk  Supplements: Immunity support, Zicam chewable, turmeric, prev. On biotin 10,000 mcg daily >>  stopped, methyl B12 and folate, probiotic, vitamin K2, niacinamide, magnesium, vitamin C.  She is on Lexapro to improve sleep - for IC (wakes up at night frequently).  ROS: + increased urination and nocturia + see HPI  I reviewed pt's medications, allergies, PMH, social hx, family hx, and changes were documented in the history of present illness. Otherwise, unchanged from my initial visit note.  PMH: -OP -Interstitial cystitis  PSxH: -Bladder  injection-Botox 2007, 2012, 2018 -Cataract surgery 2013 -Vitrectomy 2012 -Laser-assisted in situ keratomileusis 2000 -Excision of cyst in the right breast-1985, left breast-1999 -Repair of inguinal hernia 1996  Social History   Socioeconomic History   Marital status: Married    Spouse name: Not on file   Number of children: 3   Years of education: Not on file   Highest education level: Not on file  Occupational History    Homemaker/community volunteer  Tobacco Use   Smoking status: Not on file  Substance and Sexual Activity   Alcohol use:  1 glass of wine once a week   Social Determinants of Health   Financial Resource Strain:    Difficulty of Paying Living Expenses: Not on file  Food Insecurity:    Worried About Charity fundraiser in the Last Year: Not on file   [employer] of Food in the Last Year: Not on file  Transportation Needs:    Lack of Transportation (Medical): Not on file   Lack of Transportation (Non-Medical): Not on file  Physical Activity:    Days of Exercise per Week: Not on file   Minutes of Exercise per Session: Not on file  Stress:    Feeling of Stress : Not on file  Social Connections:    Frequency of Communication with Friends and Family: Not on file   Frequency of Social Gatherings with Friends and Family: Not on file   Attends Religious Services: Not on file   Active Member of Clubs or Organizations: Not on file   Attends Archivist Meetings: Not on file   Marital Status: Not on file  Intimate Partner Violence:    Fear of Current or Ex-Partner: Not on file   Emotionally Abused: Not on file   Physically Abused: Not on file   Sexually Abused: Not on file   Current Outpatient Medications  Medication Sig Dispense Refill   ondansetron (ZOFRAN-ODT) 4 MG disintegrating tablet Take 1 tablet (4 mg total) by mouth every 8 (eight) hours as needed for nausea or vomiting. 10 tablet 0   amoxicillin (AMOXIL) 500 MG capsule Take 1,000 mg by mouth  daily. (Patient not taking: Reported on 09/10/2021)     Ascorbic Acid (VITAMIN C PO) Take by mouth as needed.     benzonatate (TESSALON PERLES) 100 MG capsule Take 1 capsule (100 mg total) by mouth 3 (three) times daily as needed for cough. (Patient not taking: Reported on 09/10/2021) 30 capsule 0   cetirizine (ZYRTEC) 10 MG tablet Take 1 tablet by mouth once daily 90 tablet 0   Cholecalciferol (VITAMIN D3 PO) Take 2,000 Units by mouth daily.     diazepam (VALIUM) 10 MG tablet diazepam 10 mg tablet  INSERT 1 TABLET VAGINALLY EVERY DAY AT BEDTIME     escitalopram (LEXAPRO) 10 MG tablet Take 1 tablet by mouth once daily 90 tablet 1   fluorouracil (EFUDEX) 5 % cream Apply topically 2 (two) times daily.     fluticasone (FLONASE) 50 MCG/ACT nasal spray Place 1 spray into both nostrils  daily. 16 g 0   Homeopathic Products (ZICAM COLD REMEDY NA) by Other route in the morning and at bedtime.     HYDROcodone-acetaminophen (NORCO/VICODIN) 5-325 MG tablet Take 1-2 tablets by mouth every 6 (six) hours as needed for moderate pain. 20 tablet 0   lidocaine (XYLOCAINE) 2 % solution 5 mLs every 6 (six) hours as needed.     MAGNESIUM PO Take 200 mg by mouth daily.     NIACINAMIDE PO Take 1,000 mg by mouth daily.     OVER THE COUNTER MEDICATION Immunity support 1000mg  daily     OVER THE COUNTER MEDICATION Turmeric/curcumin 1000mg  daily     OVER THE COUNTER MEDICATION Methyl B-12 1040mcg and Methyl Folate(plus P-5-P/B-6) 429mcg once a day     Probiotic Product (PROBIOTIC DAILY PO) Take by mouth.     Current Facility-Administered Medications  Medication Dose Route Frequency Provider Last Rate Last Admin   zoledronic acid (RECLAST) injection 5 mg  5 mg Intravenous Once Philemon Kingdom, MD      + Supplements: See HPI  Allergies  Allergen Reactions   Sulfa Antibiotics Hives   FH: - Sister, M and F with HTN - MGM with uterine CA  PE: BP 110/72 (BP Location: Right Arm, Patient Position: Sitting, Cuff Size:  Normal)    Pulse 71    Ht 5' 5.5" (1.664 m)    Wt 117 lb (53.1 kg)    SpO2 97%    BMI 19.17 kg/m  Wt Readings from Last 3 Encounters:  01/07/22 117 lb (53.1 kg)  09/10/21 117 lb 3.2 oz (53.2 kg)  09/01/21 114 lb 4.8 oz (51.8 kg)   Constitutional: normal weight, in NAD Eyes: PERRLA, EOMI, no exophthalmos ENT: moist mucous membranes, no thyromegaly, no cervical lymphadenopathy Cardiovascular: RRR, No MRG Respiratory: CTA B Musculoskeletal: no deformities, strength intact in all 4 Skin: moist, warm, no rashes Neurological: no tremor with outstretched hands, DTR normal in all 4  Assessment: 1. Osteoporosis  2.  Elevated vitamin D level  3. History of hypothyroidism  Plan: 1. Osteoporosis -Likely postmenopausal and she also has a family history of osteoporosis -Reviewed previous bone density reports.  Her most recent study was from 06/09/2021.  This showed statistically significant improvement of bone density at the level of the spine (+4.4%*increase) and stability at the level of her femoral necks.  I explained that these are very good results and we decided to take a drug holiday from Reclast at that time.  Of note, on DXA scan the L4 vertebra had again be excluded from analysis due to degenerative changes. -Previously, her bone density scan report from 2021 was not directly comparable with her previous scans as it was obtained on another DXA machine.  -She has been on the week last for 2 years and on Actonel for 6 years (2013-2018) without side effects -We discussed that if the bone density decreases, we should probably start Prolia.  We discussed about benefits and possible side effects and she agreed with the plan at last visit and again today. -She is doing a good job staying on an alkaline diet, also continues to go to [facility] for skeletal loading -for the last 2 years.  She also continues to run, walk her dog, and be active.  No smoking and drinking more than 1 alcoholic drink a  day. -She has not had fractures since last visit -At this visit, we reviewed her recent calcium, kidney function, and vitamin D and they were at goal -  She is due for another bone density in 2024, however, she prefers to have them done on a yearly basis.  She has one scheduled for 06/10/2022. -I will see pt back in 1 year  2.  Elevated vitamin D level -She had a high vitamin D level in 03/2020, at 104.  I advised her to decrease her daily vitamin D supplement at that time.  Her level normalized in 08/2020. She had a very high co-pay for these tests. -She continues on vitamin D and K2 - approximately 2400 units vitamin D daily -Latest vitamin D level reviewed from 08/2021: 62.93, at goal -We will not repeat this today  3. History of hypothyroidism -Patient has a history of being on levothyroxine when she was 66 years old.  She continues on low doses until 10/2020, when we stopped the 25 mcg of levothyroxine she was taking at that time -TFTs were normal at last visit, but since then, she had a slightly elevated TSH, at 7, in 08/2021, after she had an upper respiratory infection.  We did not intervene at that time but advised her to come back for a repeat set of testing 2 months >> she had repeat TFTs in 10/2021 and these were back to normal. -No hypothyroid symptoms today other than mild weight gain of 3 pounds. -we will recheck today  Component     Latest Ref Rng & Units 01/07/2022  TSH     0.35 - 5.50 uIU/mL 5.41  T4,Free(Direct)     0.60 - 1.60 ng/dL 0.71  Triiodothyronine,Free,Serum     2.3 - 4.2 pg/mL 2.5  Thyroid tests remain normal.   Philemon Kingdom, MD PhD [facility]

## 2022-01-07 NOTE — Patient Instructions (Signed)
We will check the thyroid tests today.  Please stop at the lab.  Please return in 1 year.

## 2022-02-16 DIAGNOSIS — Z1272 Encounter for screening for malignant neoplasm of vagina: Secondary | ICD-10-CM | POA: Diagnosis not present

## 2022-02-16 DIAGNOSIS — Z01419 Encounter for gynecological examination (general) (routine) without abnormal findings: Secondary | ICD-10-CM | POA: Diagnosis not present

## 2022-02-16 DIAGNOSIS — Z1151 Encounter for screening for human papillomavirus (HPV): Secondary | ICD-10-CM | POA: Diagnosis not present

## 2022-02-16 DIAGNOSIS — Z1389 Encounter for screening for other disorder: Secondary | ICD-10-CM | POA: Diagnosis not present

## 2022-02-16 DIAGNOSIS — Z124 Encounter for screening for malignant neoplasm of cervix: Secondary | ICD-10-CM | POA: Diagnosis not present

## 2022-03-07 ENCOUNTER — Ambulatory Visit: Payer: BC Managed Care – PPO

## 2022-03-09 ENCOUNTER — Ambulatory Visit
Admission: RE | Admit: 2022-03-09 | Discharge: 2022-03-09 | Disposition: A | Discharge status: Home / Self Care | Payer: BC Managed Care – PPO | Source: Ambulatory Visit | Attending: Obstetrics and Gynecology | Admitting: Obstetrics and Gynecology

## 2022-03-09 DIAGNOSIS — Z1231 Encounter for screening mammogram for malignant neoplasm of breast: Secondary | ICD-10-CM | POA: Diagnosis not present

## 2022-03-16 ENCOUNTER — Encounter: Payer: Self-pay | Admitting: Family Medicine

## 2022-03-23 ENCOUNTER — Other Ambulatory Visit: Payer: Self-pay | Admitting: Obstetrics and Gynecology

## 2022-03-23 DIAGNOSIS — R922 Inconclusive mammogram: Secondary | ICD-10-CM

## 2022-04-02 ENCOUNTER — Ambulatory Visit
Admission: RE | Admit: 2022-04-02 | Discharge: 2022-04-02 | Disposition: A | Discharge status: Home / Self Care | Payer: Self-pay | Source: Ambulatory Visit | Attending: Obstetrics and Gynecology | Admitting: Obstetrics and Gynecology

## 2022-04-02 DIAGNOSIS — R922 Inconclusive mammogram: Secondary | ICD-10-CM

## 2022-04-02 DIAGNOSIS — Z1239 Encounter for other screening for malignant neoplasm of breast: Secondary | ICD-10-CM | POA: Diagnosis not present

## 2022-04-02 MED ORDER — GADOBUTROL 1 MMOL/ML IV SOLN
5.0000 mL | Freq: Once | INTRAVENOUS | Status: AC | PRN
  Administered 2022-04-02: 5 mL via INTRAVENOUS

## 2022-05-11 ENCOUNTER — Other Ambulatory Visit: Payer: Self-pay | Admitting: *Deleted

## 2022-05-11 MED ORDER — ESCITALOPRAM OXALATE 10 MG PO TABS: 10.0000 mg | ORAL_TABLET | Freq: Every day | ORAL | 0 refills | Status: DC

## 2022-06-10 ENCOUNTER — Encounter: Payer: Self-pay | Admitting: Internal Medicine

## 2022-06-10 ENCOUNTER — Ambulatory Visit
Admission: RE | Admit: 2022-06-10 | Discharge: 2022-06-10 | Disposition: A | Discharge status: Home / Self Care | Payer: Commercial Managed Care - HMO | Source: Ambulatory Visit | Attending: Internal Medicine | Admitting: Internal Medicine

## 2022-06-10 DIAGNOSIS — M810 Age-related osteoporosis without current pathological fracture: Secondary | ICD-10-CM

## 2022-06-10 NOTE — Progress Notes (Unsigned)
Sophia [name] Phone: [phone] Subjective:   Sophia [name], am serving as a scribe for Dr. Hulan Saas.  I'm seeing this patient by the request  of:  Sophia Conners, MD  CC: Left shoulder pain  FBP:[id number]  Last seen Sept 2022 for left knee pain  Sophia [name] is a 66 y.o. female coming in with complaint of left shoulder pain. Last seen in September 2022 for L knee pain. Patient states that she started having pain after doing a lot of lifting with grandchildren for past 2-3 months. Painful in back of shoulder to flex and ER and when lying on side at night. Denies any radiating symptoms.        Past Medical History:  Diagnosis Date   Hypothyroidism    Past Surgical History:  Procedure Laterality Date   BLADDER INSTILLATION     2007,2012,2018 botox   BREAST CYST EXCISION Bilateral 1985, 1995   both benign, 1 on each breast   BREAST SURGERY     1985-right, mid 1990s-left benign (fibrocystic breast disease)   CATARACT EXTRACTION Left 2013   EYE SURGERY Left    done for floaters   INGUINAL HERNIA REPAIR Left 1986   LASIK Bilateral    2000   Social History   Socioeconomic History   Marital status: Married    Spouse name: Not on file   Number of children: Not on file   Years of education: Not on file   Highest education level: Not on file  Occupational History   Not on file  Tobacco Use   Smoking status: Never   Smokeless tobacco: Never  Vaping Use   Vaping Use: Never used  Substance and Sexual Activity   Alcohol use: Yes    Comment: occasionally   Drug use: Not on file   Sexual activity: Not on file  Other Topics Concern   Not on file  Social History Narrative   Not on file   Social Determinants of Health   Financial Resource Strain: Not on file  Food Insecurity: Not on file  Transportation Needs: Not on file  Physical Activity: Not on file  Stress: Not on file   Social Connections: Not on file   Allergies  Allergen Reactions   Sulfa Antibiotics Hives   Family History  Problem Relation Age of Onset   Arthritis Mother    Hypertension Mother    Hypertension Father    Prostate cancer Father 2   Basal cell carcinoma Father    Hypertension Sister    Colon polyps Sister    Diabetes Mellitus II Maternal Grandmother    Uterine cancer Maternal Grandmother        ? not certain   Colon polyps Sister    Colon cancer Neg Hx    Esophageal cancer Neg Hx    Pancreatic cancer Neg Hx      Current Facility-Administered Medications (Endocrine & Metabolic):    zoledronic acid (RECLAST) injection 5 mg    Current Outpatient Medications (Respiratory):    cetirizine (ZYRTEC) 10 MG tablet, Take 1 tablet by mouth once daily   fluticasone (FLONASE) 50 MCG/ACT nasal spray, Place 1 spray into both nostrils daily.       Current Outpatient Medications (Other):    Cholecalciferol (VITAMIN D3 PO), Take 2,000 Units by mouth daily.   escitalopram (LEXAPRO) 10 MG tablet, Take 1 tablet (10 mg total) by mouth daily.   GEMTESA  75 MG TABS, Take 1 tablet by mouth daily.   Homeopathic Products [facility] COLD REMEDY NA), by Other route in the morning and at bedtime.   MAGNESIUM PO, Take 200 mg by mouth daily.   OVER THE COUNTER MEDICATION, Immunity support '1000mg'$  daily   OVER THE COUNTER MEDICATION, Turmeric/curcumin '1000mg'$  daily   OVER THE COUNTER MEDICATION, Methyl B-12 1013mg and Methyl Folate(plus P-5-P/B-6) 4037m once a day   Probiotic Product (PROBIOTIC DAILY PO), Take by mouth.   Ascorbic Acid (VITAMIN C PO), Take by mouth as needed.   fluorouracil (EFUDEX) 5 % cream, Apply topically 2 (two) times daily.   NIACINAMIDE PO, Take 1,000 mg by mouth daily.    Reviewed prior external information including notes and imaging from  primary care provider As well as notes that were available from care everywhere and other healthcare systems.  Past medical  history, social, surgical and family history all reviewed in electronic medical record.  [name] pertanent information unless stated regarding to the chief complaint.   Review of Systems:  [name] headache, visual changes, nausea, vomiting, diarrhea, constipation, dizziness, abdominal pain, skin rash, fevers, chills, night sweats, weight loss, swollen lymph nodes, body aches, joint swelling, chest pain, shortness of breath, mood changes. POSITIVE muscle aches  Objective  Blood pressure 104/66, pulse 80, height 5' 5.5" (1.664 m), SpO2 97 %.   General: [name] apparent distress alert and oriented x3 mood and affect normal, dressed appropriately.  HEENT: Pupils equal, extraocular movements intact  Respiratory: Patient's speak in full sentences and does not appear short of breath  Cardiovascular: [name] lower extremity edema, non tender, [name] erythema  Shoulder exam shows the patient does have some mild tenderness to palpation noted.  Patient does have some limited external rotation.  [name] significant weakness but pain in all different areas.  Some limited internal rotation compared to the contralateral side to sacrum.  Limited muscular skeletal ultrasound was performed and interpreted by SMHulan SaasM  Limited ultrasound of patient's shoulder shows the patient does have hypoechoic changes of the acromioclavicular joint as well as the posterior glenohumeral joint.  Patient does have an anterior abnormality noted consistent with possible anterior labral tear and biceps tendinitis. Impression: Concern for labral pathology, effusion of the left shoulder and mild arthritic changes of the acromioclavicular joint  Procedure: Real-time Ultrasound Guided Injection of left glenohumeral joint Device: GE Logiq E  Ultrasound guided injection is preferred based studies that show increased duration, increased effect, greater accuracy, decreased procedural pain, increased response rate with ultrasound guided versus blind injection.   Verbal informed consent obtained.  Time-out conducted.  Noted [name] overlying erythema, induration, or other signs of local infection.  Skin prepped in a sterile fashion.  Local anesthesia: Topical Ethyl chloride.  With sterile technique and under real time ultrasound guidance:  Joint visualized.  21g 2 inch needle inserted posterior approach. Pictures taken for needle placement. Patient did have injection of 2 cc of 0.5% Marcaine, and 1cc of Kenalog 40 mg/dL. Completed without difficulty  Pain immediately resolved suggesting accurate placement of the medication.  Advised to call if fevers/chills, erythema, induration, drainage, or persistent bleeding.  Impression: Technically successful ultrasound guided injection.  Procedure: Real-time Ultrasound Guided Injection of left acromioclavicular joint Device: GE Logiq Q7 Ultrasound guided injection is preferred based studies that show increased duration, increased effect, greater accuracy, decreased procedural pain, increased response rate, and decreased cost with ultrasound guided versus blind injection.  Verbal informed consent obtained.  Time-out conducted.  Noted [name]  overlying erythema, induration, or other signs of local infection.  Skin prepped in a sterile fashion.  Local anesthesia: Topical Ethyl chloride.  With sterile technique and under real time ultrasound guidance: With a 25-gauge half inch needle injected with 0.5 cc of 0.5% Marcaine and 0.5 cc of Kenalog 40 mg/mL Completed without difficulty  Pain immediately resolved suggesting accurate placement of the medication.  Advised to call if fevers/chills, erythema, induration, drainage, or persistent bleeding.  Impression: Technically successful ultrasound guided injection.   Impression and Recommendations:    The above documentation has been reviewed and is accurate and complete Lyndal Pulley, DO

## 2022-06-14 ENCOUNTER — Ambulatory Visit (INDEPENDENT_AMBULATORY_CARE_PROVIDER_SITE_OTHER): Payer: Commercial Managed Care - HMO | Admitting: Family Medicine

## 2022-06-14 ENCOUNTER — Encounter: Payer: Self-pay | Admitting: Internal Medicine

## 2022-06-14 ENCOUNTER — Encounter: Payer: Self-pay | Admitting: Family Medicine

## 2022-06-14 VITALS — BP 102/70 | HR 65 | Temp 98.0°F | Ht 65.5 in | Wt 115.6 lb

## 2022-06-14 DIAGNOSIS — M818 Other osteoporosis without current pathological fracture: Secondary | ICD-10-CM | POA: Diagnosis not present

## 2022-06-14 DIAGNOSIS — E039 Hypothyroidism, unspecified: Secondary | ICD-10-CM

## 2022-06-14 NOTE — Progress Notes (Signed)
Established Patient Office Visit  Subjective   Patient ID: Sophia [name], female    DOB: 1956/06/05  Age: 66 y.o. MRN: [id number]  Chief Complaint  Patient presents with   Establish Care    Patient is here for transition of care visit and to discuss there osteoporosis.   OP - patient has had this condition for several years. She is being treated by her endocrinologist and she is currently on a "break", has been off her bisphosphonate for 2 years. States that she was on Reclast and she thinks her endocrinologist is putting her on Prolia. I reviewed her DEXA scan from last week and she continues to have osteoporosis. Patient reports she is not taking a calcium supplement because she heard somewhere that it could cause colon polyps. We discussed the daily recommended intake for patients with osteoporosis and I did recommend she add this to her vitamin D supplement.       Review of Systems  All other systems reviewed and are negative.     Objective:     BP 102/70 (BP Location: Left Arm, Patient Position: Sitting, Cuff Size: Normal)   Pulse 65   Temp 98 F (36.7 C) (Oral)   Ht 5' 5.5" (1.664 m)   Wt 115 lb 9.6 oz (52.4 kg)   SpO2 99%   BMI 18.94 kg/m    Physical Exam Vitals reviewed.  Constitutional:      Appearance: Normal appearance. She is well-groomed and normal weight.  HENT:     Head: Normocephalic and atraumatic.     Mouth/Throat:     Mouth: Mucous membranes are moist.     Pharynx: Oropharynx is clear.  Eyes:     Extraocular Movements: Extraocular movements intact.     Conjunctiva/sclera: Conjunctivae normal.     Pupils: Pupils are equal, round, and reactive to light.  Cardiovascular:     Rate and Rhythm: Normal rate and regular rhythm.     Pulses: Normal pulses.     Heart sounds: S1 normal and S2 normal.  Pulmonary:     Effort: Pulmonary effort is normal.     Breath sounds: Normal breath sounds and air entry.  Abdominal:     General: Abdomen is  flat. Bowel sounds are normal.     Palpations: Abdomen is soft.  Musculoskeletal:        General: Normal range of motion.     Cervical back: Normal range of motion and neck supple.     Right lower leg: No edema.     Left lower leg: No edema.  Skin:    General: Skin is warm and dry.  Neurological:     Mental Status: She is alert and oriented to person, place, and time. Mental status is at baseline.     Gait: Gait is intact.  Psychiatric:        Mood and Affect: Mood and affect normal.        Speech: Speech normal.        Behavior: Behavior normal.        Judgment: Judgment normal.      No results found for any visits on 06/14/22.  Last thyroid functions Lab Results  Component Value Date   TSH 5.41 01/07/2022   Last vitamin D Lab Results  Component Value Date   VD25OH 62.93 09/01/2021      DEXA scan, most current DEXA scan T-score is minus 2.1.   The 10-year ASCVD risk score (Arnett DK, et al.,  2019) is: 2.3%    Assessment & Plan:   Problem List Items Addressed This Visit       Endocrine   Hypothyroid    Patient has been off thyroid medication for a couple of years. Her TSH levels were reviewed and she is in the normal range. She denies weight gain, cold intolerance, depression sx and constipation. I recommend we continue to monitor her TSH levels every 6 months. Medication not indicated at this time.        Musculoskeletal and Integument   Osteoporosis - Primary    Following with endocrine for this We discussed different options for bisphosphonate therapy. I encouraged her to start a calcium supplement to take daily along with her vitamin D. Pt states she will start taking calcium supplements.       Return in about 3 months (around 09/14/2022).    Farrel Conners, MD

## 2022-06-14 NOTE — Patient Instructions (Signed)
Follow up in 3 months for your annual exam. Please come fasting to the appointment for your annual bloodwork.

## 2022-06-14 NOTE — Assessment & Plan Note (Signed)
Following with endocrine for this We discussed different options for bisphosphonate therapy. I encouraged her to start a calcium supplement to take daily along with her vitamin D. Pt states she will start taking calcium supplements.

## 2022-06-14 NOTE — Assessment & Plan Note (Signed)
Patient has been off thyroid medication for a couple of years. Her TSH levels were reviewed and she is in the normal range. She denies weight gain, cold intolerance, depression sx and constipation. I recommend we continue to monitor her TSH levels every 6 months. Medication not indicated at this time.

## 2022-06-15 ENCOUNTER — Ambulatory Visit (INDEPENDENT_AMBULATORY_CARE_PROVIDER_SITE_OTHER): Payer: BC Managed Care – PPO

## 2022-06-15 ENCOUNTER — Ambulatory Visit: Payer: Commercial Managed Care - HMO | Admitting: Family Medicine

## 2022-06-15 ENCOUNTER — Ambulatory Visit: Payer: Self-pay

## 2022-06-15 VITALS — BP 104/66 | HR 80 | Ht 65.5 in

## 2022-06-15 DIAGNOSIS — M25512 Pain in left shoulder: Secondary | ICD-10-CM

## 2022-06-15 DIAGNOSIS — M25519 Pain in unspecified shoulder: Secondary | ICD-10-CM | POA: Insufficient documentation

## 2022-06-15 DIAGNOSIS — M25412 Effusion, left shoulder: Secondary | ICD-10-CM | POA: Diagnosis not present

## 2022-06-15 DIAGNOSIS — M19012 Primary osteoarthritis, left shoulder: Secondary | ICD-10-CM | POA: Diagnosis not present

## 2022-06-15 NOTE — Patient Instructions (Signed)
Great to see you  Ice 20 minutes 2 times daily. Usually after activity and before bed. Voltaren topically 2 times a day  Injected the shoulder in 2 areas Keep hands within peripheral vision  Exercises 3 times a week.  Xray on the way out  See me again in 6 weeks

## 2022-06-15 NOTE — Assessment & Plan Note (Signed)
Injection given today and tolerated the procedure well.  Discussed icing regimen and exercises.  Likely has some underlying arthritic changes.  Secondary to patient's osteoporosis we may need to consider further advanced imaging for an occult fracture if needed.  Differential also includes the possibility of early frozen shoulder.  Also I am concerned for a SLAP tear with the abnormal findings on the ultrasound of the bicep tendon.  Patient will work with the conservative therapy and see me again in 6 weeks

## 2022-06-15 NOTE — Assessment & Plan Note (Signed)
Mild arthritic changes but patient did have a small effusion.  Given injection today and tolerated the procedure well.  Discussed icing regimen and home exercises otherwise.  Follow-up again in 6 to 8 weeks

## 2022-06-16 ENCOUNTER — Telehealth: Payer: Self-pay

## 2022-06-16 ENCOUNTER — Encounter: Payer: Self-pay | Admitting: Family Medicine

## 2022-06-16 NOTE — Telephone Encounter (Signed)
Prolia VOB initiated via [email or web address] ? ?New start ? ?

## 2022-06-22 NOTE — Telephone Encounter (Signed)
VOB resubmitted using [employer].

## 2022-06-22 NOTE — Telephone Encounter (Signed)
Your patient's health plan has advised that your patient's policy terminated on 94/17/4081.

## 2022-06-30 NOTE — Telephone Encounter (Signed)
Prior auth required for PROLIA  PA PROCESS DETAILS: PA is required. Call [phone] to initiate over the phone, or to request a PA form be faxed to your office. PA may also be initiated via web by accessing [email or web address]. Please clearly indicate on the PA that the request is for the medical benefit.    

## 2022-07-04 ENCOUNTER — Other Ambulatory Visit: Payer: Self-pay

## 2022-07-04 DIAGNOSIS — M25512 Pain in left shoulder: Secondary | ICD-10-CM

## 2022-07-14 ENCOUNTER — Encounter: Payer: Self-pay | Admitting: Internal Medicine

## 2022-07-14 NOTE — Telephone Encounter (Signed)
This medication may be excluded from the patient's benefit. For more information, please reach out to [facility] directly at [phone].

## 2022-07-14 NOTE — Telephone Encounter (Signed)
Prior Authorization initiated for PROLIA via [email or web address] KEY: [id number]

## 2022-07-19 MED ORDER — DENOSUMAB 60 MG/ML ~~LOC~~ SOSY: 60.0000 mg | PREFILLED_SYRINGE | Freq: Once | SUBCUTANEOUS | 0 refills | Status: AC

## 2022-07-19 NOTE — Addendum Note (Signed)
Addended by: Jasper Loser on: 07/19/2022 07:31 AM   Modules accepted: Orders

## 2022-07-19 NOTE — Telephone Encounter (Addendum)
Rx for Prolia sent to [facility].

## 2022-07-20 ENCOUNTER — Ambulatory Visit
Admission: RE | Admit: 2022-07-20 | Discharge: 2022-07-20 | Disposition: A | Discharge status: Home / Self Care | Payer: Commercial Managed Care - HMO | Source: Ambulatory Visit | Attending: Family Medicine | Admitting: Family Medicine

## 2022-07-20 ENCOUNTER — Ambulatory Visit
Admission: RE | Admit: 2022-07-20 | Discharge: 2022-07-20 | Disposition: A | Discharge status: Home / Self Care | Payer: BC Managed Care – PPO | Source: Ambulatory Visit | Attending: Family Medicine | Admitting: Family Medicine

## 2022-07-20 DIAGNOSIS — M25512 Pain in left shoulder: Secondary | ICD-10-CM

## 2022-07-20 MED ORDER — IOPAMIDOL (ISOVUE-M 200) INJECTION 41%
12.0000 mL | Freq: Once | INTRAMUSCULAR | Status: AC
  Administered 2022-07-20: 12 mL via INTRA_ARTICULAR

## 2022-07-20 NOTE — Progress Notes (Signed)
[place] Phone: [phone] Subjective:   Sophia [name], am serving as a scribe for Dr. Hulan Saas.  I'm seeing this patient by the request  of:  Farrel Conners, MD  CC: Shoulder pain follow-up  FWY:[id number]  06/15/2022 Mild arthritic changes but patient did have a small effusion.  Given injection today and tolerated the procedure well.  Discussed icing regimen and home exercises otherwise.  Follow-up again in 6 to 8 weeks  Injection given today and tolerated the procedure well.  Discussed icing regimen and exercises.   Likely has some underlying arthritic changes.  Secondary to patient's osteoporosis we may need to consider further advanced imaging for an occult fracture if needed.  Differential also includes the possibility of early frozen shoulder.  Also I am concerned for a SLAP tear with the abnormal findings on the ultrasound of the bicep tendon.  Patient will work with the conservative therapy and see me again in 6 weeks  Update 07/27/2022 Sophia [name] is a 66 y.o. female coming in with complaint of L shoulder pain.  Was given injection at last exam.  Patient states that she is doing much better. Painful to IR arm but ADLs are pain-free. Has been doing strength exercises at [employer].      Past Medical History:  Diagnosis Date   Hypothyroidism    Past Surgical History:  Procedure Laterality Date   BLADDER INSTILLATION     2007,2012,2018 botox   BREAST CYST EXCISION Bilateral 1985, 1995   both benign, 1 on each breast   BREAST SURGERY     1985-right, mid 1990s-left benign (fibrocystic breast disease)   CATARACT EXTRACTION Left 2013   EYE SURGERY Left    done for floaters   INGUINAL HERNIA REPAIR Left 1986   LASIK Bilateral    2000   Social History   Socioeconomic History   Marital status: Married    Spouse name: Not on file   Number of children: Not on file   Years of  education: Not on file   Highest education level: Not on file  Occupational History   Not on file  Tobacco Use   Smoking status: Never   Smokeless tobacco: Never  Vaping Use   Vaping Use: Never used  Substance and Sexual Activity   Alcohol use: Yes    Comment: occasionally   Drug use: Not on file   Sexual activity: Not on file  Other Topics Concern   Not on file  Social History Narrative   Not on file   Social Determinants of Health   Financial Resource Strain: Not on file  Food Insecurity: Not on file  Transportation Needs: Not on file  Physical Activity: Not on file  Stress: Not on file  Social Connections: Not on file   Allergies  Allergen Reactions   Sulfa Antibiotics Hives   Family History  Problem Relation Age of Onset   Arthritis Mother    Hypertension Mother    Hypertension Father    Prostate cancer Father 82   Basal cell carcinoma Father    Hypertension Sister    Colon polyps Sister    Diabetes Mellitus II Maternal Grandmother    Uterine cancer Maternal Grandmother        ? not certain   Colon polyps Sister    Colon cancer Neg Hx    Esophageal cancer Neg Hx    Pancreatic cancer Neg Hx  Current Facility-Administered Medications (Endocrine & Metabolic):    zoledronic acid (RECLAST) injection 5 mg    Current Outpatient Medications (Respiratory):    cetirizine (ZYRTEC) 10 MG tablet, Take 1 tablet by mouth once daily   fluticasone (FLONASE) 50 MCG/ACT nasal spray, Place 1 spray into both nostrils daily.       Current Outpatient Medications (Other):    Cholecalciferol (VITAMIN D3 PO), Take 2,000 Units by mouth daily.   escitalopram (LEXAPRO) 10 MG tablet, Take 1 tablet (10 mg total) by mouth daily.   GEMTESA 75 MG TABS, Take 1 tablet by mouth daily.   Homeopathic Products [facility] COLD REMEDY NA), by Other route in the morning and at bedtime.   MAGNESIUM PO, Take 200 mg by mouth daily.   OVER THE COUNTER MEDICATION, Immunity support '1000mg'$   daily   OVER THE COUNTER MEDICATION, Turmeric/curcumin '1000mg'$  daily   OVER THE COUNTER MEDICATION, Methyl B-12 1065mg and Methyl Folate(plus P-5-P/B-6) 4070m once a day   Probiotic Product (PROBIOTIC DAILY PO), Take by mouth.   Ascorbic Acid (VITAMIN C PO), Take by mouth as needed.   fluorouracil (EFUDEX) 5 % cream, Apply topically 2 (two) times daily.   NIACINAMIDE PO, Take 1,000 mg by mouth daily.    Review of Systems:  [name] headache, visual changes, nausea, vomiting, diarrhea, constipation, dizziness, abdominal pain, skin rash, fevers, chills, night sweats, weight loss, swollen lymph nodes, body aches, joint swelling, chest pain, shortness of breath, mood changes. POSITIVE muscle aches  Objective  Blood pressure 102/64, pulse 75, height 5' 5.5" (1.664 m), weight 116 lb (52.6 kg), SpO2 98 %.   General: [name] apparent distress alert and oriented x3 mood and affect normal, dressed appropriately.  HEENT: Pupils equal, extraocular movements intact  Respiratory: Patient's speak in full sentences and does not appear short of breath  Cardiovascular: [name] lower extremity edema, non tender, [name] erythema  Shoulder exam shows good range of motion overall.  Patient does have relatively good strength noted.  Mild crepitus noted.  Some limited external range of motion secondary to the arthritic changes but otherwise mild.     Impression and Recommendations:    The above documentation has been reviewed and is accurate and complete Sophia PulleyDO

## 2022-07-27 ENCOUNTER — Ambulatory Visit: Payer: Commercial Managed Care - HMO | Admitting: Family Medicine

## 2022-07-27 ENCOUNTER — Encounter: Payer: Self-pay | Admitting: Internal Medicine

## 2022-07-27 ENCOUNTER — Ambulatory Visit: Payer: Self-pay

## 2022-07-27 ENCOUNTER — Encounter: Payer: Self-pay | Admitting: Family Medicine

## 2022-07-27 ENCOUNTER — Other Ambulatory Visit (HOSPITAL_COMMUNITY): Payer: Self-pay

## 2022-07-27 VITALS — BP 102/64 | HR 75 | Ht 65.5 in | Wt 116.0 lb

## 2022-07-27 DIAGNOSIS — G8929 Other chronic pain: Secondary | ICD-10-CM

## 2022-07-27 DIAGNOSIS — M25512 Pain in left shoulder: Secondary | ICD-10-CM

## 2022-07-27 DIAGNOSIS — M25412 Effusion, left shoulder: Secondary | ICD-10-CM

## 2022-07-27 NOTE — Patient Instructions (Signed)
Glad you are doing better See me in 3 months just in case

## 2022-07-27 NOTE — Assessment & Plan Note (Signed)
Patient did have an effusion and was found to have some arthritic changes noted of the shoulder.  Patient does feel like she is making improvement though at this time.  We discussed different treatment options but as long as this seems to help we will continue to monitor and can repeat every 3 to 6 months.  Patient will continue to limited in certain range of motion and follow-up with me again in 12 week s

## 2022-07-30 NOTE — Telephone Encounter (Signed)
You  Sophia Kingdom, MD; [facility] Clinical [facility] 33 minutes ago (2:42 PM)    It looks like pt has commercial Cigna so pt should be eligible for enrollment in the [employer] copay assistance program. Please provide pt with the following information to self-enroll.   59% of commercial prescriptions cost $50 or less per six months, the remaining 41% of commercial prescriptions cost an average of $437 per six months.    Your out-of-pocket costs may vary depending on your insurance plan. Each plan has different out-of-pocket costs, and most include an annual deductible. Patients on high deductible plans may pay more out-of-pocket for Prolia.   And eligible commercial patients could pay $25 or less with the [place] for 1 shot of Prolia up to the program maximum of $1,500 per calendar year.  Find out more about the [place]   Based on paid claims data from national data providers for the period 11/21/2016 - 11/20/2017. These data do not include out-of-pocket costs related to office visits or administration of Prolia. Your actual cost may vary depending on your insurance coverage and eligibility for and participation in support programs such as the Prolia co-pay program. Talk to your insurance provider for specific information about your prescription coverage   This program does not provide support for any physician-related services associated with administration of Prolia. See eligibility and limits.  For more information about this program and eligibility requirements, call 7800654353 or visit [email or web address].   The [place] is issued by [employer] pursuant to license by [employer]. No cash or ATM access. [employer] is a Chief of Staff. This card can be used only to cover co-payment for eligible prescriptions covered under the program at participating merchant  locations where [employer] is accepted.     Bjorn Loser Yiu  P [facility] Clinical [facility] (supporting Sophia Kingdom, MD) 4 days ago   KM Thanks. I takes to [place] today and it seems correct that it's not covered based on our new insurance. (We'd have to hit a deductible and then it only pays 50 percent). But in a year I'll be on Medicare and it's partially covered there. I'll go ahead and schedule the first one whenever you can. I believe it's about $1600 but I'll see if I can find out. Appreciate all your help!

## 2022-07-31 ENCOUNTER — Other Ambulatory Visit: Payer: Self-pay | Admitting: Family

## 2022-08-08 ENCOUNTER — Encounter: Payer: Self-pay | Admitting: Family Medicine

## 2022-08-08 DIAGNOSIS — F419 Anxiety disorder, unspecified: Secondary | ICD-10-CM

## 2022-08-09 ENCOUNTER — Ambulatory Visit (INDEPENDENT_AMBULATORY_CARE_PROVIDER_SITE_OTHER): Payer: Commercial Managed Care - HMO

## 2022-08-09 DIAGNOSIS — M810 Age-related osteoporosis without current pathological fracture: Secondary | ICD-10-CM

## 2022-08-09 MED ORDER — DENOSUMAB 60 MG/ML ~~LOC~~ SOSY
60.0000 mg | PREFILLED_SYRINGE | Freq: Once | SUBCUTANEOUS | Status: AC
  Administered 2022-08-09: 60 mg via SUBCUTANEOUS

## 2022-08-09 MED ORDER — ESCITALOPRAM OXALATE 10 MG PO TABS: 10.0000 mg | ORAL_TABLET | Freq: Every day | ORAL | 1 refills | Status: DC

## 2022-08-09 NOTE — Progress Notes (Signed)
Patient verbally confirmed name, date of birth, and correct medication to be administered. Prolia injection administered and pt tolerated well.  

## 2022-08-12 ENCOUNTER — Telehealth: Payer: Self-pay

## 2022-08-12 NOTE — Telephone Encounter (Signed)
Called and lvm for pharmacy advising ICD-10 Code for Prolia is Heath.0

## 2022-08-12 NOTE — Telephone Encounter (Signed)
Lvm requesting ICD-10 code for Prolia.

## 2022-08-15 ENCOUNTER — Other Ambulatory Visit (HOSPITAL_COMMUNITY): Payer: Self-pay

## 2022-08-29 ENCOUNTER — Encounter: Payer: Self-pay | Admitting: Family Medicine

## 2022-08-29 ENCOUNTER — Ambulatory Visit: Payer: Commercial Managed Care - HMO | Admitting: Family Medicine

## 2022-08-29 VITALS — BP 98/72 | HR 84 | Temp 98.4°F | Ht 65.5 in | Wt 114.4 lb

## 2022-08-29 DIAGNOSIS — J0100 Acute maxillary sinusitis, unspecified: Secondary | ICD-10-CM | POA: Diagnosis not present

## 2022-08-29 DIAGNOSIS — J019 Acute sinusitis, unspecified: Secondary | ICD-10-CM | POA: Insufficient documentation

## 2022-08-29 DIAGNOSIS — M818 Other osteoporosis without current pathological fracture: Secondary | ICD-10-CM

## 2022-08-29 DIAGNOSIS — E039 Hypothyroidism, unspecified: Secondary | ICD-10-CM

## 2022-08-29 DIAGNOSIS — Z13220 Encounter for screening for lipoid disorders: Secondary | ICD-10-CM | POA: Diagnosis not present

## 2022-08-29 MED ORDER — AMOXICILLIN-POT CLAVULANATE 875-125 MG PO TABS
1.0000 | ORAL_TABLET | Freq: Two times a day (BID) | ORAL | 0 refills | Status: DC
Start: 2022-08-29 — End: 2023-04-24

## 2022-08-29 NOTE — Assessment & Plan Note (Signed)
BL maxillary tenderness, pt's length of sx, sinus pressure, and history of getting better then abruptly getting worse again is all consistent with secondary bacterial infection will treat with augmentin BID for 10 days.

## 2022-08-29 NOTE — Progress Notes (Signed)
Established Patient Office Visit  Subjective   Patient ID: Sophia [name], female    DOB: October 23, 1956  Age: 66 y.o. MRN: [id number]  Chief Complaint  Patient presents with   Cough    Productive with green sputum x2 weeks, tried Robitussin and Zyrtec with some relief    States this happens every fall, is more greenish now and having a lot of phlegm. Denies any fever or chills. No sore throat, no ear pain, no sinus pressure in the face. She reports she tested for COVID and it was negative. Thought that she was getting better initially but then got worse again the second week.  Cough This is a new problem. The current episode started 1 to 4 weeks ago. The problem has been unchanged. The problem occurs every few minutes. The cough is Productive of sputum. Associated symptoms include nasal congestion, postnasal drip and rhinorrhea. Pertinent negatives include no chills, ear congestion, ear pain, fever, sore throat, shortness of breath or wheezing. The symptoms are aggravated by cold air. She has tried OTC cough suppressant for the symptoms. The treatment provided no relief.   Current Outpatient Medications  Medication Instructions   amoxicillin-clavulanate (AUGMENTIN) 875-125 MG tablet 1 tablet, Oral, 2 times daily   cetirizine (ZYRTEC) 10 MG tablet Take 1 tablet by mouth once daily   Cholecalciferol (VITAMIN D3 PO) 2,000 Units, Oral, Daily   escitalopram (LEXAPRO) 10 mg, Oral, Daily   fluticasone (FLONASE) 50 MCG/ACT nasal spray 1 spray, Each Nare, Daily   GEMTESA 75 MG TABS 1 tablet, Oral, Daily   Homeopathic Products (ZICAM COLD REMEDY NA) Other, 2 times daily   MAGNESIUM PO 200 mg, Oral, Daily   OVER THE COUNTER MEDICATION Immunity support '1000mg'$  daily    OVER THE COUNTER MEDICATION Turmeric/curcumin '1000mg'$  daily    OVER THE COUNTER MEDICATION Methyl B-12 1096mg and Methyl Folate(plus P-5-P/B-6) 4026m once a day    Probiotic Product (PROBIOTIC DAILY PO) Oral     Patient Active  Problem List   Diagnosis Date Noted   Acute sinusitis 08/29/2022   Effusion of joint of left shoulder 06/15/2022   AC joint pain 06/15/2022   Ganglion and cyst of synovium, tendon and bursa 07/03/2021   Loss of transverse plantar arch 07/03/2021   Metatarsalgia of right foot 04/26/2021   Pain in left foot 04/26/2021   Insomnia 03/19/2021   Hypothyroid 08/31/2020   Osteoporosis 03/01/2020   Interstitial cystitis 03/01/2020   Basal cell carcinoma 03/01/2020   Bunion 12/25/2019      Review of Systems  Constitutional:  Negative for chills and fever.  HENT:  Positive for postnasal drip and rhinorrhea. Negative for ear pain and sore throat.   Respiratory:  Positive for cough. Negative for shortness of breath and wheezing.   All other systems reviewed and are negative.     Objective:     BP 98/72 (BP Location: Left Arm, Patient Position: Sitting, Cuff Size: Normal)   Pulse 84   Temp 98.4 F (36.9 C) (Oral)   Ht 5' 5.5" (1.664 m)   Wt 114 lb 6.4 oz (51.9 kg)   SpO2 98%   BMI 18.75 kg/m    Physical Exam Vitals reviewed.  Constitutional:      Appearance: Normal appearance. She is well-groomed and normal weight.  HENT:     Right Ear: Tympanic membrane and ear canal normal.     Left Ear: Tympanic membrane and ear canal normal.     Nose:  Right Sinus: Maxillary sinus tenderness present.     Left Sinus: Maxillary sinus tenderness present.     Mouth/Throat:     Pharynx: No oropharyngeal exudate or posterior oropharyngeal erythema.  Eyes:     Conjunctiva/sclera: Conjunctivae normal.  Cardiovascular:     Rate and Rhythm: Normal rate and regular rhythm.     Heart sounds: S1 normal and S2 normal.  Pulmonary:     Effort: Pulmonary effort is normal.     Breath sounds: Normal breath sounds and air entry.  Neurological:     Mental Status: She is alert and oriented to person, place, and time. Mental status is at baseline.     Gait: Gait is intact.  Psychiatric:        Mood  and Affect: Mood and affect normal.        Speech: Speech normal.        Behavior: Behavior normal.        Judgment: Judgment normal.      No results found for any visits on 08/29/22.    The 10-year ASCVD risk score (Arnett DK, et al., 2019) is: 2.4%    Assessment & Plan:   Problem List Items Addressed This Visit       Respiratory   Acute sinusitis    BL maxillary tenderness, pt's length of sx, sinus pressure, and history of getting better then abruptly getting worse again is all consistent with secondary bacterial infection will treat with augmentin BID for 10 days.       Relevant Medications   amoxicillin-clavulanate (AUGMENTIN) 875-125 MG tablet     Endocrine   Hypothyroid - Primary   Relevant Orders   CMP   TSH     Musculoskeletal and Integument   Osteoporosis   Relevant Orders   Vitamin D, 25-hydroxy   Other Visit Diagnoses     Lipid screening       Relevant Orders   Lipid Panel      Pt will be seeing me later this week for her annual physical, I ordered her labs for the day before that visit, I did not evaluate these medical issues at this time. I just treated the sinus infection today. No follow-ups on file.    Farrel Conners, MD

## 2022-09-01 ENCOUNTER — Other Ambulatory Visit (INDEPENDENT_AMBULATORY_CARE_PROVIDER_SITE_OTHER): Payer: Commercial Managed Care - HMO

## 2022-09-01 DIAGNOSIS — M818 Other osteoporosis without current pathological fracture: Secondary | ICD-10-CM

## 2022-09-01 DIAGNOSIS — E039 Hypothyroidism, unspecified: Secondary | ICD-10-CM

## 2022-09-01 DIAGNOSIS — Z13220 Encounter for screening for lipoid disorders: Secondary | ICD-10-CM

## 2022-09-01 LAB — COMPREHENSIVE METABOLIC PANEL
ALT: 17 U/L (ref 0–35)
AST: 21 U/L (ref 0–37)
Albumin: 4.2 g/dL (ref 3.5–5.2)
Alkaline Phosphatase: 44 U/L (ref 39–117)
BUN: 13 mg/dL (ref 6–23)
CO2: 28 mEq/L (ref 19–32)
Calcium: 9.5 mg/dL (ref 8.4–10.5)
Chloride: 104 mEq/L (ref 96–112)
Creatinine, Ser: 0.84 mg/dL (ref 0.40–1.20)
GFR: 73.58 mL/min (ref >60.00)
Glucose, Bld: 86 mg/dL (ref 70–99)
Potassium: 4.3 mEq/L (ref 3.5–5.1)
Sodium: 140 mEq/L (ref 135–145)
Total Bilirubin: 0.5 mg/dL (ref 0.2–1.2)
Total Protein: 7.1 g/dL (ref 6.0–8.3)

## 2022-09-01 LAB — TSH: TSH: 6.84 u[IU]/mL — ABNORMAL HIGH (ref 0.35–5.50)

## 2022-09-01 LAB — VITAMIN D 25 HYDROXY (VIT D DEFICIENCY, FRACTURES): VITD: 71.78 ng/mL (ref 30.00–100.00)

## 2022-09-01 LAB — LIPID PANEL
Cholesterol: 163 mg/dL (ref 0–200)
HDL: 63.5 mg/dL (ref >39.00)
LDL Cholesterol: 88 mg/dL (ref 0–99)
NonHDL: 99.48
Total CHOL/HDL Ratio: 3
Triglycerides: 57 mg/dL (ref 0.0–149.0)
VLDL: 11.4 mg/dL (ref 0.0–40.0)

## 2022-09-02 ENCOUNTER — Ambulatory Visit (INDEPENDENT_AMBULATORY_CARE_PROVIDER_SITE_OTHER): Payer: Commercial Managed Care - HMO | Admitting: Family Medicine

## 2022-09-02 ENCOUNTER — Encounter: Payer: BC Managed Care – PPO | Admitting: Family Medicine

## 2022-09-02 ENCOUNTER — Encounter: Payer: Self-pay | Admitting: Family Medicine

## 2022-09-02 DIAGNOSIS — Z0000 Encounter for general adult medical examination without abnormal findings: Secondary | ICD-10-CM | POA: Diagnosis not present

## 2022-09-02 DIAGNOSIS — E039 Hypothyroidism, unspecified: Secondary | ICD-10-CM | POA: Diagnosis not present

## 2022-09-02 LAB — T3, FREE: T3, Free: 3.2 pg/mL (ref 2.3–4.2)

## 2022-09-02 LAB — T4, FREE: Free T4: 0.86 ng/dL (ref 0.60–1.60)

## 2022-09-02 NOTE — Progress Notes (Signed)
Complete physical exam  Patient: Sophia [name]   DOB: 06/13/1956   66 y.o. Female  MRN: [id number]  Subjective:    Chief Complaint  Patient presents with  . Annual Exam    Sophia [name] is a 66 y.o. female who presents today for a complete physical exam. She reports consuming a general diet. Home exercise routine includes 2 mild jog in the morning, walking a lot also. She generally feels well. She reports sleeping well. She does not have additional problems to discuss today. Pt has been going to a program called [employer] where she lifts weights. Sees an eye doctor regularly, no vision problems at this time.    Most recent fall risk assessment:    06/14/2022    8:32 AM  [place] in the past year? 0  Number falls in past yr: 0  Injury with Fall? 0  Risk for fall due to : No Fall Risks  Follow up Falls evaluation completed     Most recent depression screenings:    09/02/2022    1:33 PM 06/14/2022    8:31 AM  PHQ 2/9 Scores  PHQ - 2 Score 0 0  PHQ- 9 Score 0 0    Dental: No current dental problems and Receives regular dental care  Patient Active Problem List   Diagnosis Date Noted  . Acute sinusitis 08/29/2022  . Effusion of joint of left shoulder 06/15/2022  . AC joint pain 06/15/2022  . Ganglion and cyst of synovium, tendon and bursa 07/03/2021  . Loss of transverse plantar arch 07/03/2021  . Metatarsalgia of right foot 04/26/2021  . Pain in left foot 04/26/2021  . Insomnia 03/19/2021  . Hypothyroid 08/31/2020  . Osteoporosis 03/01/2020  . Interstitial cystitis 03/01/2020  . Basal cell carcinoma 03/01/2020  . Bunion 12/25/2019      Patient Care Team: Farrel Conners, MD as PCP - General (Family Medicine) Paula Compton, MD as Consulting Physician (Obstetrics and Gynecology) Ashley Royalty, [place] (Physician Assistant) Warden Fillers, MD as Consulting Physician (Ophthalmology)   Outpatient Medications Prior to Visit   Medication Sig  . amoxicillin-clavulanate (AUGMENTIN) 875-125 MG tablet Take 1 tablet by mouth 2 (two) times daily.  . cetirizine (ZYRTEC) 10 MG tablet Take 1 tablet by mouth once daily  . Cholecalciferol (VITAMIN D3 PO) Take 2,000 Units by mouth daily.  Marland Kitchen escitalopram (LEXAPRO) 10 MG tablet Take 1 tablet (10 mg total) by mouth daily.  Marland Kitchen GEMTESA 75 MG TABS Take 1 tablet by mouth daily.  . Homeopathic Products [facility] COLD REMEDY NA) by Other route in the morning and at bedtime.  Marland Kitchen MAGNESIUM PO Take 200 mg by mouth daily.  Marland Kitchen OVER THE COUNTER MEDICATION Immunity support '1000mg'$  daily  . OVER THE COUNTER MEDICATION Turmeric/curcumin '1000mg'$  daily  . OVER THE COUNTER MEDICATION Methyl B-12 107mg and Methyl Folate(plus P-5-P/B-6) 4077m once a day  . Probiotic Product (PROBIOTIC DAILY PO) Take by mouth.  . [DISCONTINUED] fluticasone (FLONASE) 50 MCG/ACT nasal spray Place 1 spray into both nostrils daily.   Facility-Administered Medications Prior to Visit  Medication Dose Route Frequency Provider  . zoledronic acid (RECLAST) injection 5 mg  5 mg Intravenous Once GhPhilemon KingdomMD    Review of Systems  All other systems reviewed and are negative.         Objective:     BP (!) 100/50 (BP Location: Left Arm, Patient Position: Sitting, Cuff Size: Normal)   Pulse 80  Temp 98.6 F (37 C) (Oral)   Ht 5' 5.75" (1.67 m)   Wt 115 lb 9.6 oz (52.4 kg)   SpO2 100%   BMI 18.80 kg/m  {Vitals History (Optional):23777}  Physical Exam Vitals reviewed.  Constitutional:      Appearance: Normal appearance. She is well-groomed and normal weight.  HENT:     Head: Normocephalic and atraumatic.     Mouth/Throat:     Mouth: Mucous membranes are moist.     Pharynx: Oropharynx is clear.  Eyes:     Extraocular Movements: Extraocular movements intact.     Conjunctiva/sclera: Conjunctivae normal.     Pupils: Pupils are equal, round, and reactive to light.  Cardiovascular:     Rate and Rhythm:  Normal rate and regular rhythm.     Pulses: Normal pulses.     Heart sounds: S1 normal and S2 normal.  Pulmonary:     Effort: Pulmonary effort is normal.     Breath sounds: Normal breath sounds and air entry.  Abdominal:     General: Abdomen is flat. Bowel sounds are normal.     Palpations: Abdomen is soft.  Musculoskeletal:        General: Normal range of motion.     Cervical back: Normal range of motion and neck supple.     Right lower leg: No edema.     Left lower leg: No edema.  Skin:    General: Skin is warm and dry.  Neurological:     Mental Status: She is alert and oriented to person, place, and time. Mental status is at baseline.     Gait: Gait is intact.  Psychiatric:        Mood and Affect: Mood and affect normal.        Speech: Speech normal.        Behavior: Behavior normal.        Judgment: Judgment normal.     No results found for any visits on 09/02/22. {Show previous labs (optional):23779}    Assessment & Plan:    Routine Health Maintenance and Physical Exam  Immunization History  Administered Date(s) Administered  . Influenza,inj,Quad PF,6+ Mos 08/29/2021  . Influenza-Unspecified 08/07/2019, 08/11/2020, 08/11/2022  . PFIZER(Purple Top)SARS-COV-2 Vaccination 12/28/2019, 01/18/2020, 08/25/2020, 03/06/2021, 08/12/2022  . Pension scheme manager 72yr & up 08/12/2021  . Td 04/01/2003  . Tdap 08/07/2018  . Zoster Recombinat (Shingrix) 12/26/2016, 04/10/2017    Health Maintenance  Topic Date Due  . HIV Screening  06/15/2023 (Originally 07/11/1973)  . COVID-19 Vaccine (7 - Pfizer risk series) 10/07/2022  . PAP SMEAR-Modifier  12/22/2022  . MAMMOGRAM  03/09/2024  . COLONOSCOPY (Pts 45-423yrInsurance coverage will need to be confirmed)  05/07/2026  . TETANUS/TDAP  08/07/2028  . INFLUENZA VACCINE  Completed  . Hepatitis C Screening  Completed  . Zoster Vaccines- Shingrix  Completed  . HPV VACCINES  Aged Out    Discussed health benefits  of physical activity, and encouraged her to engage in regular exercise appropriate for her age and condition.  Problem List Items Addressed This Visit   None  No follow-ups on file.     BaFarrel ConnersMD

## 2022-09-05 LAB — THYROID PEROXIDASE ANTIBODIES (TPO) (REFL): Thyroperoxidase Ab SerPl-aCnc: <1 IU/mL (ref <9)

## 2022-09-05 NOTE — Progress Notes (Signed)
T4 and T3 are both normal, we will recheck again in 6 months-- does not require treatment at this time

## 2022-09-07 DIAGNOSIS — Z0000 Encounter for general adult medical examination without abnormal findings: Secondary | ICD-10-CM | POA: Insufficient documentation

## 2022-09-07 NOTE — Assessment & Plan Note (Signed)
TSH was elevated, I recommended ordering T3, T4 and TPO antibodies. Further recommendations to follow.

## 2022-09-07 NOTE — Assessment & Plan Note (Signed)
Normal physical exam findings, will see her back yearly

## 2022-09-22 NOTE — Telephone Encounter (Signed)
Last Prolia inj 08/09/22 Next Prolia inj due 02/08/23

## 2022-10-10 ENCOUNTER — Encounter: Payer: Self-pay | Admitting: Family Medicine

## 2022-10-24 NOTE — Progress Notes (Unsigned)
Sophia [name] [place] Phone: [phone] Subjective:   Sophia [name], am serving as a scribe for Dr. Hulan [name]. I'm seeing this patient by the request  of:  Sophia Conners, MD  CC: Left shoulder and hand pain  NIO:[id number]  07/27/2022 Patient did have an effusion and was found to have some arthritic changes noted of the shoulder.  Patient does feel like she is making improvement though at this time.  We discussed different treatment options but as long as this seems to help we will continue to monitor and can repeat every 3 to 6 months.  Patient will continue to limited in certain range of motion and follow-up with me again in 12 week s     Update 10/26/2022 Sophia [name] is a 66 y.o. female coming in with complaint of L shoulder pain. Patient states that she is doing ok. Had to stop taking [employer] classes due to pain in her arm. Painful to reach behind her body and reach for clothes out of the dryer. Injections in the past have given her about 3 months of relief.   Patient also c/o pain in R hand ring finger over PIP joint. Also has pain in L hand 4th and 5th PIP joints.        Past Medical History:  Diagnosis Date   Hypothyroidism    Past Surgical History:  Procedure Laterality Date   BLADDER INSTILLATION     2007,2012,2018 botox   BREAST CYST EXCISION Bilateral 1985, 1995   both benign, 1 on each breast   BREAST SURGERY     1985-right, mid 1990s-left benign (fibrocystic breast disease)   CATARACT EXTRACTION Left 2013   EYE SURGERY Left    done for floaters   INGUINAL HERNIA REPAIR Left 1986   LASIK Bilateral    2000   Social History   Socioeconomic History   Marital status: Married    Spouse name: Not on file   Number of children: Not on file   Years of education: Not on file   Highest education level: Not on file  Occupational History   Not on file  Tobacco Use   Smoking status:  Never   Smokeless tobacco: Never  Vaping Use   Vaping Use: Never used  Substance and Sexual Activity   Alcohol use: Yes    Comment: occasionally   Drug use: Not on file   Sexual activity: Not on file  Other Topics Concern   Not on file  Social History Narrative   Not on file   Social Determinants of Health   Financial Resource Strain: Not on file  Food Insecurity: Not on file  Transportation Needs: Not on file  Physical Activity: Not on file  Stress: Not on file  Social Connections: Not on file   Allergies  Allergen Reactions   Sulfa Antibiotics Hives   Family History  Problem Relation Age of Onset   Arthritis Mother    Hypertension Mother    Hypertension Father    Prostate cancer Father 32   Basal cell carcinoma Father    Hypertension Sister    Colon polyps Sister    Diabetes Mellitus II Maternal Grandmother    Uterine cancer Maternal Grandmother        ? not certain   Colon polyps Sister    Colon cancer Neg Hx    Esophageal cancer Neg Hx    Pancreatic cancer Neg Hx  Current Facility-Administered Medications (Endocrine & Metabolic):    zoledronic acid (RECLAST) injection 5 mg    Current Outpatient Medications (Respiratory):    cetirizine (ZYRTEC) 10 MG tablet, Take 1 tablet by mouth once daily       Current Outpatient Medications (Other):    amoxicillin-clavulanate (AUGMENTIN) 875-125 MG tablet, Take 1 tablet by mouth 2 (two) times daily.   Cholecalciferol (VITAMIN D3 PO), Take 2,000 Units by mouth daily.   escitalopram (LEXAPRO) 10 MG tablet, Take 1 tablet (10 mg total) by mouth daily.   GEMTESA 75 MG TABS, Take 1 tablet by mouth daily.   Homeopathic Products [facility] COLD REMEDY NA), by Other route in the morning and at bedtime.   MAGNESIUM PO, Take 200 mg by mouth daily.   OVER THE COUNTER MEDICATION, Immunity support '1000mg'$  daily   OVER THE COUNTER MEDICATION, Turmeric/curcumin '1000mg'$  daily   OVER THE COUNTER MEDICATION, Methyl B-12 1053mg  and Methyl Folate(plus P-5-P/B-6) 4070m once a day   Probiotic Product (PROBIOTIC DAILY PO), Take by mouth.    Reviewed prior external information including notes and imaging from  primary care provider As well as notes that were available from care everywhere and other healthcare systems.  Past medical history, social, surgical and family history all reviewed in electronic medical record.  [name] pertanent information unless stated regarding to the chief complaint.   Review of Systems:  [name] headache, visual changes, nausea, vomiting, diarrhea, constipation, dizziness, abdominal pain, skin rash, fevers, chills, night sweats, weight loss, swollen lymph nodes, body aches, joint swelling, chest pain, shortness of breath, mood changes. POSITIVE muscle aches  Objective  Blood pressure 102/64, pulse 82, height 5' 5.75" (1.67 m), weight 119 lb (54 kg), SpO2 99 %.   General: [name] apparent distress alert and oriented x3 mood and affect normal, dressed appropriately.  HEENT: Pupils equal, extraocular movements intact  Respiratory: Patient's speak in full sentences and does not appear short of breath  Cardiovascular: [name] lower extremity edema, non tender, [name] erythema  Left hand exam does have some swelling noted of the synovium.  The patient does have good range of motion though. Left shoulder exam does have some mild atrophy noted.  Some tenderness to palpation noted as well.  Mild crepitus noted.  Positive crossover noted.  Patient is also having severe tenderness over the left greater trochanteric area.  Positive FABER test on the left side.  Negative straight leg test.   Procedure: Real-time Ultrasound Guided Injection of left  greater trochanteric bursitis secondary to patient's body habitus Device: GE Logiq Q7  Ultrasound guided injection is preferred based studies that show increased duration, increased effect, greater accuracy, decreased procedural pain, increased response rate, and decreased cost  with ultrasound guided versus blind injection.  Verbal informed consent obtained.  Time-out conducted.  Noted [name] overlying erythema, induration, or other signs of local infection.  Skin prepped in a sterile fashion.  Local anesthesia: Topical Ethyl chloride.  With sterile technique and under real time ultrasound guidance:  Greater trochanteric area was visualized and patient's bursa was noted. A 22-gauge 3 inch needle was inserted and 4 cc of 0.5% Marcaine and 1 cc of Kenalog 40 mg/dL was injected. Pictures taken Completed without difficulty  Pain immediately resolved suggesting accurate placement of the medication.  Advised to call if fevers/chills, erythema, induration, drainage, or persistent bleeding.  Impression: Technically successful ultrasound guided injection.  Procedure: Real-time Ultrasound Guided Injection of left glenohumeral joint Device: GE Logiq E  Ultrasound guided injection is  preferred based studies that show increased duration, increased effect, greater accuracy, decreased procedural pain, increased response rate with ultrasound guided versus blind injection.  Verbal informed consent obtained.  Time-out conducted.  Noted [name] overlying erythema, induration, or other signs of local infection.  Skin prepped in a sterile fashion.  Local anesthesia: Topical Ethyl chloride.  With sterile technique and under real time ultrasound guidance:  Joint visualized.  21g 2 inch needle inserted posterior approach. Pictures taken for needle placement. Patient did have injection of 2 cc of 0.5% Marcaine, and 1cc of Kenalog 40 mg/dL. Completed without difficulty  Pain immediately resolved suggesting accurate placement of the medication.  Advised to call if fevers/chills, erythema, induration, drainage, or persistent bleeding.  Impression: Technically successful ultrasound guided injection.  Procedure: Real-time Ultrasound Guided Injection of left acromioclavicular joint Device: GE Logiq  Q7 Ultrasound guided injection is preferred based studies that show increased duration, increased effect, greater accuracy, decreased procedural pain, increased response rate, and decreased cost with ultrasound guided versus blind injection.  Verbal informed consent obtained.  Time-out conducted.  Noted [name] overlying erythema, induration, or other signs of local infection.  Skin prepped in a sterile fashion.  Local anesthesia: Topical Ethyl chloride.  With sterile technique and under real time ultrasound guidance: With a 25-gauge half inch needle injected with 0.5 cc of 0.5% Marcaine and 0.5 cc of Kenalog 40 mg/mL Completed without difficulty  Pain immediately resolved suggesting accurate placement of the medication.  Advised to call if fevers/chills, erythema, induration, drainage, or persistent bleeding.  Impression: Technically successful ultrasound guided injection.    Impression and Recommendations:      The above documentation has been reviewed and is accurate and complete Lyndal Pulley, DO

## 2022-10-26 ENCOUNTER — Ambulatory Visit: Payer: Commercial Managed Care - HMO | Admitting: Family Medicine

## 2022-10-26 ENCOUNTER — Ambulatory Visit: Payer: Self-pay

## 2022-10-26 VITALS — BP 102/64 | HR 82 | Ht 65.75 in | Wt 119.0 lb

## 2022-10-26 DIAGNOSIS — M2550 Pain in unspecified joint: Secondary | ICD-10-CM

## 2022-10-26 DIAGNOSIS — M25512 Pain in left shoulder: Secondary | ICD-10-CM

## 2022-10-26 DIAGNOSIS — M1909 Primary osteoarthritis, other specified site: Secondary | ICD-10-CM | POA: Diagnosis not present

## 2022-10-26 DIAGNOSIS — G8929 Other chronic pain: Secondary | ICD-10-CM | POA: Diagnosis not present

## 2022-10-26 DIAGNOSIS — M7062 Trochanteric bursitis, left hip: Secondary | ICD-10-CM

## 2022-10-26 DIAGNOSIS — M25412 Effusion, left shoulder: Secondary | ICD-10-CM | POA: Diagnosis not present

## 2022-10-26 DIAGNOSIS — M659 Synovitis and tenosynovitis, unspecified: Secondary | ICD-10-CM

## 2022-10-26 LAB — COMPREHENSIVE METABOLIC PANEL
ALT: 18 U/L (ref 0–35)
AST: 23 U/L (ref 0–37)
Albumin: 4.5 g/dL (ref 3.5–5.2)
Alkaline Phosphatase: 42 U/L (ref 39–117)
BUN: 16 mg/dL (ref 6–23)
CO2: 30 mEq/L (ref 19–32)
Calcium: 9.3 mg/dL (ref 8.4–10.5)
Chloride: 102 mEq/L (ref 96–112)
Creatinine, Ser: 0.88 mg/dL (ref 0.40–1.20)
GFR: 69.51 mL/min (ref >60.00)
Glucose, Bld: 83 mg/dL (ref 70–99)
Potassium: 3.8 mEq/L (ref 3.5–5.1)
Sodium: 139 mEq/L (ref 135–145)
Total Bilirubin: 0.4 mg/dL (ref 0.2–1.2)
Total Protein: 7.2 g/dL (ref 6.0–8.3)

## 2022-10-26 LAB — CBC WITH DIFFERENTIAL/PLATELET
Basophils Absolute: 0.1 10*3/uL (ref 0.0–0.1)
Basophils Relative: 0.9 % (ref 0.0–3.0)
Eosinophils Absolute: 0.4 10*3/uL (ref 0.0–0.7)
Eosinophils Relative: 4.4 % (ref 0.0–5.0)
HCT: 40.2 % (ref 36.0–46.0)
Hemoglobin: 13.5 g/dL (ref 12.0–15.0)
Lymphocytes Relative: 26 % (ref 12.0–46.0)
Lymphs Abs: 2.1 10*3/uL (ref 0.7–4.0)
MCHC: 33.5 g/dL (ref 30.0–36.0)
MCV: 89.6 fl (ref 78.0–100.0)
Monocytes Absolute: 0.6 10*3/uL (ref 0.1–1.0)
Monocytes Relative: 8 % (ref 3.0–12.0)
Neutro Abs: 4.8 10*3/uL (ref 1.4–7.7)
Neutrophils Relative %: 60.7 % (ref 43.0–77.0)
Platelets: 261 10*3/uL (ref 150.0–400.0)
RBC: 4.48 Mil/uL (ref 3.87–5.11)
RDW: 13.6 % (ref 11.5–15.5)
WBC: 7.9 10*3/uL (ref 4.0–10.5)

## 2022-10-26 LAB — IBC PANEL
Iron: 92 ug/dL (ref 42–145)
Saturation Ratios: 22.9 % (ref 20.0–50.0)
TIBC: 401.8 ug/dL (ref 250.0–450.0)
Transferrin: 287 mg/dL (ref 212.0–360.0)

## 2022-10-26 LAB — SEDIMENTATION RATE: Sed Rate: 16 mm/hr (ref 0–30)

## 2022-10-26 LAB — FERRITIN: Ferritin: 13.1 ng/mL (ref 10.0–291.0)

## 2022-10-26 LAB — URIC ACID: Uric Acid, Serum: 4.2 mg/dL (ref 2.4–7.0)

## 2022-10-26 LAB — C-REACTIVE PROTEIN: CRP: <1 mg/dL (ref 0.5–20.0)

## 2022-10-26 LAB — VITAMIN B12: Vitamin B-12: 840 pg/mL (ref 211–911)

## 2022-10-26 LAB — TSH: TSH: 6.29 u[IU]/mL — ABNORMAL HIGH (ref 0.35–5.50)

## 2022-10-26 LAB — VITAMIN D 25 HYDROXY (VIT D DEFICIENCY, FRACTURES): VITD: 71.84 ng/mL (ref 30.00–100.00)

## 2022-10-26 NOTE — Assessment & Plan Note (Signed)
Will get x-rays to further evaluate.  Discussed posture and ergonomics.  Discussed inflammatory food, food testing patient is going to do over-the-counter as well.  Follow-up with me after also laboratory workup.

## 2022-10-26 NOTE — Assessment & Plan Note (Signed)
Repeat injection given today, tolerated the procedure well.  Chronic problem with worsening symptoms.  Wants to hold on anything such as a replacement.  Discussed posture and ergonomics.  Discussed bone health.  Follow-up again in 8-12 weeks

## 2022-10-26 NOTE — Patient Instructions (Addendum)
Labs today [email or web address] Injected hip, shoulder and AC jt today Tart cherry extract '1200mg'$  at night Glove liners at night Arnica lotion See me again in 3 months

## 2022-10-26 NOTE — Assessment & Plan Note (Signed)
Patient given injection again today.  Did not have significant reaccumulation of the swelling but worsening pain.  Affecting daily activities.  We discussed icing regimen and home exercises.  Discussed over-the-counter natural supplements.  Discussed ibuprofen as well.  Follow-up again in 8-12 weeks.

## 2022-10-26 NOTE — Assessment & Plan Note (Signed)
Patient given injection today and tolerated the procedure well, discussed icing regimen and home exercises.  Discussed hip abductor strengthening.  Differential includes lumbar radiculopathy but we will monitor.  Follow-up again in 6 to 8 weeks

## 2022-10-27 ENCOUNTER — Encounter: Payer: Self-pay | Admitting: Family Medicine

## 2022-10-27 ENCOUNTER — Encounter: Payer: Self-pay | Admitting: Internal Medicine

## 2022-10-27 ENCOUNTER — Other Ambulatory Visit: Payer: Self-pay | Admitting: Internal Medicine

## 2022-10-27 DIAGNOSIS — Z8639 Personal history of other endocrine, nutritional and metabolic disease: Secondary | ICD-10-CM

## 2022-10-27 MED ORDER — LEVOTHYROXINE SODIUM 25 MCG PO TABS: 25.0000 ug | ORAL_TABLET | Freq: Every day | ORAL | 3 refills | Status: DC

## 2022-10-29 LAB — CYCLIC CITRUL PEPTIDE ANTIBODY, IGG: Cyclic Citrullin Peptide Ab: <16 UNITS

## 2022-10-29 LAB — ANGIOTENSIN CONVERTING ENZYME: Angiotensin-Converting Enzyme: 23 U/L (ref 9–67)

## 2022-10-29 LAB — CALCIUM, IONIZED: Calcium, Ion: 5.2 mg/dL (ref 4.7–5.5)

## 2022-10-29 LAB — PTH, INTACT AND CALCIUM
Calcium: 9.8 mg/dL (ref 8.6–10.4)
PTH: 31 pg/mL (ref 16–77)

## 2022-10-29 LAB — ANTI-NUCLEAR AB-TITER (ANA TITER): ANA Titer 1: 1:40 {titer} — ABNORMAL HIGH

## 2022-10-29 LAB — ANA: Anti Nuclear Antibody (ANA): POSITIVE — AB

## 2022-10-29 LAB — RHEUMATOID FACTOR: Rheumatoid fact SerPl-aCnc: <14 IU/mL (ref <14)

## 2022-11-01 ENCOUNTER — Encounter: Payer: Self-pay | Admitting: Family Medicine

## 2022-11-11 ENCOUNTER — Encounter: Payer: Self-pay | Admitting: Internal Medicine

## 2022-11-11 ENCOUNTER — Other Ambulatory Visit: Payer: Self-pay | Admitting: Obstetrics and Gynecology

## 2022-11-11 ENCOUNTER — Other Ambulatory Visit: Payer: Self-pay | Admitting: Internal Medicine

## 2022-11-11 DIAGNOSIS — Z1231 Encounter for screening mammogram for malignant neoplasm of breast: Secondary | ICD-10-CM

## 2022-11-11 DIAGNOSIS — M818 Other osteoporosis without current pathological fracture: Secondary | ICD-10-CM

## 2022-12-08 ENCOUNTER — Other Ambulatory Visit: Payer: Commercial Managed Care - HMO

## 2022-12-09 ENCOUNTER — Other Ambulatory Visit: Payer: Commercial Managed Care - HMO

## 2022-12-13 ENCOUNTER — Other Ambulatory Visit (INDEPENDENT_AMBULATORY_CARE_PROVIDER_SITE_OTHER): Payer: Commercial Managed Care - HMO

## 2022-12-13 DIAGNOSIS — Z8639 Personal history of other endocrine, nutritional and metabolic disease: Secondary | ICD-10-CM | POA: Diagnosis not present

## 2022-12-13 LAB — T4, FREE: Free T4: 0.96 ng/dL (ref 0.60–1.60)

## 2022-12-13 LAB — TSH: TSH: 3.24 u[IU]/mL (ref 0.35–5.50)

## 2022-12-16 ENCOUNTER — Encounter: Payer: Self-pay | Admitting: Family Medicine

## 2022-12-17 ENCOUNTER — Encounter: Payer: Self-pay | Admitting: Family Medicine

## 2022-12-23 NOTE — Telephone Encounter (Signed)
Prolia VOB initiated via [email or web address]  Last Prolia inj 08/09/22 Next Prolia inj due 02/08/23

## 2023-01-09 ENCOUNTER — Ambulatory Visit: Payer: BC Managed Care – PPO | Admitting: Internal Medicine

## 2023-01-10 ENCOUNTER — Encounter: Payer: Self-pay | Admitting: Internal Medicine

## 2023-01-10 ENCOUNTER — Ambulatory Visit: Payer: Commercial Managed Care - HMO | Admitting: Internal Medicine

## 2023-01-10 VITALS — BP 100/62 | HR 84 | Ht 65.75 in | Wt 117.0 lb

## 2023-01-10 DIAGNOSIS — Z8639 Personal history of other endocrine, nutritional and metabolic disease: Secondary | ICD-10-CM

## 2023-01-10 DIAGNOSIS — E673 Hypervitaminosis D: Secondary | ICD-10-CM | POA: Diagnosis not present

## 2023-01-10 DIAGNOSIS — M818 Other osteoporosis without current pathological fracture: Secondary | ICD-10-CM | POA: Diagnosis not present

## 2023-01-10 NOTE — Patient Instructions (Addendum)
Please come back for labs in 3 months.  Please return in 1 year.

## 2023-01-10 NOTE — Progress Notes (Signed)
Patient ID: Sophia [name], female   DOB: May 31, 1956, 67 y.o.   MRN: [id number]   HPI  Sophia [name] is a 67 y.o.-year-old female, initially referred by Dr. Marvel Plan, returning for follow-up for osteoporosis, also history of hypothyroidism and high vitamin D. She lived in [place] and then in [place] in 07/2019.  She established care with [facility] in 02/2020.  Last visit 1 year ago.  Interim history: No fractures since last visit. No orthostasis, dizziness, vertigo, vision problems. She is walking/running few miles a day. She feels better after she started Levothyroxine, with less body aches. She has L shoulder pain - had a steroid inj. Also got steroid inj's for L hip bursitis.  She was diagnosed with osteoporosis in 2018.  Reviewed latest DXA scans: Date L1-L3 (L4) T score FN T score 33% distal Radius Ultra distal radius  [facility]/10/2022 ([facility]) -2.1 (-3.4%*) RFN: -1.6 LFN: -2.1 -1.5 -3.2  06/09/2021 ([facility]) -2.2 RFN: -1.7 LFN: -1.6 N/a N/a  12/10/2019 ([facility]) -2.5 RFN: -1.6 LFN: -1.5 n/a N/a  10/10/2018 -2.2 RFN: -1.5 LFN: -1.7 n/a N/a  10/09/2017 -2.6 RFN: -1.5 LFN:-1.8 n/a N/a   No fractures. She did have 3 falls since moving to [place] (tripping), but she attributes this to a particular pair of shoes.  Previous osteoporosis treatments: - Actonel - for 5 years, until 2018 - Reclast 10/2017, 10/2018, 10/06/2020 - Prolia- started 08/09/2022  No history of vitamin D deficiency: Lab Results  Component Value Date   VD25OH 71.84 10/26/2022   VD25OH 71.78 09/01/2022   VD25OH 62.93 09/01/2021   VD25OH 76 08/31/2020   VD25OH 80.9 03/31/2020   VD25OH 100.64 (H) 03/30/2020   VD25OH 104.70 ([place]) 01/29/2020  07/2019: vitamin D 89 She had problems with her insurance paying for her vitamin D levels.  Pt is on: - vitamin D 2000 IU + MVI >> after last visit, decreased to 1000 units +  + osteobiflex  - vitamin K2 + 400 IU vit D (total: ~2000  units)  She drinks almond milk, eats cheese, yogurt. She does weightbearing exercises.  She also walks. Since continues OsteoStrong skeletal loading-started 02/2020 - once a week >> stopped since last visit 2/2 shoulder pain.   She does not take high vitamin A doses.  Menopause was at 67 y/o.  No history of HRT.  FH of osteoporosis: mother.  No hyper or hypocalcemia, hyperparathyroidism.  No history of kidney stones. Lab Results  Component Value Date   PTH 31 10/26/2022   CALCIUM 9.8 10/26/2022   CALCIUM 9.3 10/26/2022   CALCIUM 9.5 09/01/2022   CALCIUM 10.1 09/01/2021   CALCIUM 9.8 08/31/2020   CALCIUM 9.8 01/29/2020   CALCIUM 9.6 08/07/2019  07/2019: Ca 9.6  She has a history of hypothyroidism, previously on levothyroxine 50 mcg daily since age 82 >> then decreased to 25 mcg, and then off since 10/2020.  We restarted LT4 25 mcg daily this in 10/2022: - in am - fasting - at least 30 min from b'fast - no calcium - no iron - no multivitamins - no PPIs - not on Biotin  Latest TFTs: Lab Results  Component Value Date   TSH 3.24 12/13/2022   TSH 6.29 (H) 10/26/2022   TSH 6.84 (H) 09/01/2022   TSH 5.41 01/07/2022   TSH 5.13 10/27/2021  07/2019: TSH 0.48  No CKD. Last BUN/Cr: Lab Results  Component Value Date   BUN 16 10/26/2022   CREATININE 0.88 10/26/2022  07/2019: BUN/Cr:  11/0.88, GFR >60  Diet: - Coffee with creamer - Breakfast: Oatmeal with almonds, flax/Chia seeds or coconut yogurt - Lunch: Skips - Midafternoon snack: Nuts, cheese, popcorn - Dinner: Patient/poultry with vegetables/greens, smoothie with almond milk  Supplements: Immunity support, turmeric, prev. On biotin 10,000 mcg daily >> stopped, methyl B12 and folate, probiotic, vitamin K2, niacinamide, magnesium, vitamin C.  She is on Lexapro to improve sleep - for IC (wakes up at night frequently).  ROS: + see HPI  I reviewed pt's medications, allergies, PMH, social hx, family hx, and changes  were documented in the history of present illness. Otherwise, unchanged from my initial visit note.  PMH: -OP -Interstitial cystitis  PSxH: -Bladder injection-Botox 2007, 2012, 2018 -Cataract surgery 2013 -Vitrectomy 2012 -Laser-assisted in situ keratomileusis 2000 -Excision of cyst in the right breast-1985, left breast-1999 -Repair of inguinal hernia 1996  Social History   Socioeconomic History   Marital status: Married    Spouse name: Not on file   Number of children: 3   Years of education: Not on file   Highest education level: Not on file  Occupational History    Homemaker/community volunteer  Tobacco Use   Smoking status: Not on file  Substance and Sexual Activity   Alcohol use:  1 glass of wine once a week   Social Determinants of Health   Financial Resource Strain:    Difficulty of Paying Living Expenses: Not on file  Food Insecurity:    Worried About Charity fundraiser in the Last Year: Not on file   [employer] of Food in the Last Year: Not on file  Transportation Needs:    Lack of Transportation (Medical): Not on file   Lack of Transportation (Non-Medical): Not on file  Physical Activity:    Days of Exercise per Week: Not on file   Minutes of Exercise per Session: Not on file  Stress:    Feeling of Stress : Not on file  Social Connections:    Frequency of Communication with Friends and Family: Not on file   Frequency of Social Gatherings with Friends and Family: Not on file   Attends Religious Services: Not on file   Active Member of Clubs or Organizations: Not on file   Attends Archivist Meetings: Not on file   Marital Status: Not on file  Intimate Partner Violence:    Fear of Current or Ex-Partner: Not on file   Emotionally Abused: Not on file   Physically Abused: Not on file   Sexually Abused: Not on file   Current Outpatient Medications  Medication Sig Dispense Refill   levothyroxine (SYNTHROID) 25 MCG tablet Take 1 tablet (25 mcg  total) by mouth daily. 90 tablet 3   amoxicillin-clavulanate (AUGMENTIN) 875-125 MG tablet Take 1 tablet by mouth 2 (two) times daily. 20 tablet 0   cetirizine (ZYRTEC) 10 MG tablet Take 1 tablet by mouth once daily 90 tablet 0   Cholecalciferol (VITAMIN D3 PO) Take 2,000 Units by mouth daily.     escitalopram (LEXAPRO) 10 MG tablet Take 1 tablet (10 mg total) by mouth daily. 90 tablet 1   GEMTESA 75 MG TABS Take 1 tablet by mouth daily.     Homeopathic Products [facility] COLD REMEDY NA) by Other route in the morning and at bedtime.     MAGNESIUM PO Take 200 mg by mouth daily.     OVER THE COUNTER MEDICATION Immunity support 1041m daily     OVER THE COUNTER MEDICATION Turmeric/curcumin  1080m daily     OVER THE COUNTER MEDICATION Methyl B-12 10042m and Methyl Folate(plus P-5-P/B-6) 40069monce a day     Probiotic Product (PROBIOTIC DAILY PO) Take by mouth.     Current Facility-Administered Medications  Medication Dose Route Frequency Provider Last Rate Last Admin   zoledronic acid (RECLAST) injection 5 mg  5 mg Intravenous Once GhePhilemon KingdomD      + Supplements: See HPI  Allergies  Allergen Reactions   Sulfa Antibiotics Hives   FH: - Sister, M and F with HTN - MGM with uterine CA  PE: BP 100/62 (BP Location: Right Arm, Patient Position: Sitting, Cuff Size: Normal)   Pulse 84   Ht 5' 5.75" (1.67 m)   Wt 117 lb (53.1 kg)   SpO2 99%   BMI 19.03 kg/m  Wt Readings from Last 3 Encounters:  01/10/23 117 lb (53.1 kg)  10/26/22 119 lb (54 kg)  09/02/22 115 lb 9.6 oz (52.4 kg)   Constitutional: normal weight, in NAD Eyes:  EOMI, no exophthalmos ENT: no neck masses, no cervical lymphadenopathy Cardiovascular: RRR, No MRG Respiratory: CTA B Musculoskeletal: no deformities Skin:no rashes Neurological: no tremor with outstretched hands  Assessment: 1. Osteoporosis  2.  Elevated vitamin D level  3. History of hypothyroidism  Plan: 1. Osteoporosis -Likely postmenopausal  and she also has a family history of osteoporosis -Reviewed previous bone density scans: Latest was from 06/10/2022 and this showed statistically significant worsening at 3.4% at the level of the spine: T-score was -2.1.  The left femoral neck T-score was lower.  The 33% distal radius T-score was in the osteopenic range, at -1.5, while the ultra distal radius T-score was -3.2, quite low.  However, I do not have previous T-scores at the same site, to compare.  In 05/2021, her bone density was statistically significantly improved at the level of the spine (last time 4.4% increase) and was stable at her femoral necks.  Her previous bone density scan report from 2021 was not directly comparable with the other scans since it was obtained on admission DXA machine. -She has been on Reclast for 2 years (2020-2021) and Actonel for 6 years (2013-2018) without side effects -Since the bone density scan is lower, at today's visit we discussed about starting Prolia -she was able to start this in 07/2022.  No jaw/thigh/hip pain.  She has bursitis, though, for which she got a steroid injection.  Also gets these for her shoulder.  We discussed about the negative effects of steroids on her bone density and to only get these  -She is doing a good job staying on an alkaline diet and continued OsteoStrong for skeletal loading for more than 20 years, but now off due to shoulder pain.  She is planning to restart.  She also continues to walk, does Pilates, and is very active.  No smoking and drinking more than 1 alcoholic drink a day. -No fractures since last visit -Her latest ionized calcium is from 10/26/2022 and this was, at 5.2.  PTH was also normal, at 31, and vitamin D was normal at 71.84. -Will continue Prolia for now.  She is going to be on Medicare next year.  We discussed that after this, they may not cover yearly DXA scans.  She would prefer to continue to have yearly scans, though. -I will see pt back in 1 year  2.   Elevated vitamin D level -She had a high vitamin D level in 03/2020, at 104.  I advised her to decrease her daily vitamin D supplement at that time.  -She continues on vitamin D and K2 - approximately 2400 units vitamin D daily -Vitamin D level from  10/2022: 71.84 -We will repeat this at next visit  3. History of hypothyroidism -Patient has a history of being on levothyroxine when she was 67 years old.  She continues on low doses until 10/2020.  We stopped the 25 mcg levothyroxine that she was taking at that time. -However, TSH increased slightly above target range and she wanted to retry a low-dose levothyroxine.  She is now back on 25 mcg daily. - latest thyroid labs reviewed with pt. >> normal: Lab Results  Component Value Date   TSH 3.24 12/13/2022  - pt feels better after she started levothyroxine - we discussed about taking the thyroid hormone every day, with water, >30 minutes before breakfast, separated by >4 hours from acid reflux medications, calcium, iron, multivitamins. Pt. is taking it correctly. - will check thyroid tests in 3 months  Orders Placed This Encounter  Procedures   T4, free   TSH   Philemon Kingdom, MD PhD [facility] Endocrinology

## 2023-01-16 NOTE — Telephone Encounter (Signed)
Prior auth renewal required for [employer] Insurance risk surveyor)  PA PROCESS DETAILS: PA is required. Call [phone] to initiate over the phone, or to request a PA form be faxed to your office. PA may also be initiated via web by accessing [email or web address]. Please clearly indicate on the PA that the request is for the medical benefit.

## 2023-01-17 ENCOUNTER — Other Ambulatory Visit: Payer: Self-pay | Admitting: Internal Medicine

## 2023-01-19 NOTE — Progress Notes (Signed)
Zach Adryana Mogensen [place] [address] [place] Phone: [phone] Subjective:   IVilma Meckel, am serving as a scribe for Dr. Hulan Saas.  I'm seeing this patient by the request  of:  Farrel Conners, MD  CC: Shoulder pain follow-up  [id number]  10/26/2022 Will get x-rays to further evaluate.  Discussed posture and ergonomics.  Discussed inflammatory food, food testing patient is going to do over-the-counter as well.  Follow-up with me after also laboratory workup.     Patient given injection today and tolerated the procedure well, discussed icing regimen and home exercises. Discussed hip abductor strengthening. Differential includes lumbar radiculopathy but we will monitor. Follow-up again in 6 to 8 weeks   Repeat injection given today, tolerated the procedure well.  Chronic problem with worsening symptoms.  Wants to hold on anything such as a replacement.  Discussed posture and ergonomics.  Discussed bone health.  Follow-up again in 8-12 weeks     Update 01/25/2023 MANERVIA MUSCO is a 67 y.o. female coming in with complaint of L hip & L shoulder pain. Patient states shoulder still hurts with extension. May want another injection, but worried about bone density. Wants to talk swelling in fingers. Hip is doing okay. Doing yoga and stretching has been helping. No other concerns.      Past Medical History:  Diagnosis Date   Hypothyroidism    Past Surgical History:  Procedure Laterality Date   BLADDER INSTILLATION     2007,2012,2018 botox   BREAST CYST EXCISION Bilateral 1985, 1995   both benign, 1 on each breast   BREAST SURGERY     1985-right, mid 1990s-left benign (fibrocystic breast disease)   CATARACT EXTRACTION Left 2013   EYE SURGERY Left    done for floaters   INGUINAL HERNIA REPAIR Left 1986   LASIK Bilateral    2000   Social History   Socioeconomic History   Marital status: Married    Spouse name: Not on file    Number of children: Not on file   Years of education: Not on file   Highest education level: Not on file  Occupational History   Not on file  Tobacco Use   Smoking status: Never   Smokeless tobacco: Never  Vaping Use   Vaping Use: Never used  Substance and Sexual Activity   Alcohol use: Yes    Comment: occasionally   Drug use: Not on file   Sexual activity: Not on file  Other Topics Concern   Not on file  Social History Narrative   Not on file   Social Determinants of Health   Financial Resource Strain: Not on file  Food Insecurity: Not on file  Transportation Needs: Not on file  Physical Activity: Not on file  Stress: Not on file  Social Connections: Not on file   Allergies  Allergen Reactions   Sulfa Antibiotics Hives   Family History  Problem Relation Age of Onset   Arthritis Mother    Hypertension Mother    Hypertension Father    Prostate cancer Father 63   Basal cell carcinoma Father    Hypertension Sister    Colon polyps Sister    Diabetes Mellitus II Maternal Grandmother    Uterine cancer Maternal Grandmother        ? not certain   Colon polyps Sister    Colon cancer Neg Hx    Esophageal cancer Neg Hx    Pancreatic cancer Neg Hx  Current Outpatient Medications (Endocrine & Metabolic):    denosumab (PROLIA) 60 MG/ML SOSY injection, INJECT 60 MG UNDER THE SKIN ONCE FOR 1 DOSE   levothyroxine (SYNTHROID) 25 MCG tablet, Take 1 tablet (25 mcg total) by mouth daily.   Current Outpatient Medications (Respiratory):    cetirizine (ZYRTEC) 10 MG tablet, Take 1 tablet by mouth once daily    Current Outpatient Medications (Other):    amoxicillin-clavulanate (AUGMENTIN) 875-125 MG tablet, Take 1 tablet by mouth 2 (two) times daily.   Cholecalciferol (VITAMIN D3 PO), Take 2,000 Units by mouth daily.   escitalopram (LEXAPRO) 10 MG tablet, Take 1 tablet (10 mg total) by mouth daily. (Patient taking differently: Take 5 mg by mouth daily.)   GEMTESA 75 MG  TABS, Take 1 tablet by mouth daily.   MAGNESIUM PO, Take 200 mg by mouth daily.   OVER THE COUNTER MEDICATION, Immunity support '1000mg'$  daily   OVER THE COUNTER MEDICATION, Turmeric/curcumin '1000mg'$  daily   OVER THE COUNTER MEDICATION, Methyl B-12 102mg and Methyl Folate(plus P-5-P/B-6) 4017m once a day   Probiotic Product (PROBIOTIC DAILY PO), Take by mouth.   Reviewed prior external information including notes and imaging from  primary care provider As well as notes that were available from care everywhere and other healthcare systems.  Past medical history, social, surgical and family history all reviewed in electronic medical record.  No pertanent information unless stated regarding to the chief complaint.   Review of Systems:  No headache, visual changes, nausea, vomiting, diarrhea, constipation, dizziness, abdominal pain, skin rash, fevers, chills, night sweats, weight loss, swollen lymph nodes, body aches, joint swelling, chest pain, shortness of breath, mood changes. POSITIVE muscle aches  Objective  Blood pressure 106/68, pulse 79, height '5\' 5"'$  (1.651 m), weight 119 lb (54 kg), SpO2 99 %.   General: No apparent distress alert and oriented x3 mood and affect normal, dressed appropriately.  HEENT: Pupils equal, extraocular movements intact  Respiratory: Patient's speak in full sentences and does not appear short of breath  Cardiovascular: No lower extremity edema, non tender, no erythema  Left shoulder exam does have some chronic instability noted with certain range of motion.  Mild crepitus.  Rotator cuff strength 3+ out of 5 compared to the contralateral side.  Procedure: Real-time Ultrasound Guided Injection of left glenohumeral joint Device: GE Logiq E  Ultrasound guided injection is preferred based studies that show increased duration, increased effect, greater accuracy, decreased procedural pain, increased response rate with ultrasound guided versus blind injection.  Verbal  informed consent obtained.  Time-out conducted.  Noted no overlying erythema, induration, or other signs of local infection.  Skin prepped in a sterile fashion.  Local anesthesia: Topical Ethyl chloride.  With sterile technique and under real time ultrasound guidance:  Joint visualized.  21g 2 inch needle inserted posterior approach. Pictures taken for needle placement. Patient did have injection of 2 cc of 0.5% Marcaine, and 1cc of Kenalog 40 mg/dL. Completed without difficulty  Pain immediately resolved suggesting accurate placement of the medication.  Advised to call if fevers/chills, erythema, induration, drainage, or persistent bleeding.  Impression: Technically successful ultrasound guided injection.    Impression and Recommendations:     The above documentation has been reviewed and is accurate and complete ZaLyndal PulleyDO

## 2023-01-24 ENCOUNTER — Ambulatory Visit: Payer: Commercial Managed Care - HMO | Admitting: Family Medicine

## 2023-01-24 ENCOUNTER — Ambulatory Visit: Payer: Self-pay

## 2023-01-24 ENCOUNTER — Encounter: Payer: Self-pay | Admitting: Family Medicine

## 2023-01-24 VITALS — BP 106/68 | HR 79 | Ht 65.0 in | Wt 119.0 lb

## 2023-01-24 DIAGNOSIS — M25412 Effusion, left shoulder: Secondary | ICD-10-CM

## 2023-01-24 DIAGNOSIS — M25512 Pain in left shoulder: Secondary | ICD-10-CM

## 2023-01-24 DIAGNOSIS — G8929 Other chronic pain: Secondary | ICD-10-CM | POA: Diagnosis not present

## 2023-01-24 NOTE — Assessment & Plan Note (Signed)
Chronic problem with worsening symptoms.  Discussed again about the possibility of other alternative treatments such as PRP or surgical intervention.  Patient wants to avoid certain activities, home exercises, icing regimen.  Discussed low back could be beneficial.  Patient is concerned secondary to frequency of steroids and discussed when to contact keep this from every 10 to 12 weeks if necessary.  Could alternate with also Toradol injections if patient would like.  Follow-up again in 8 to 12 weeks total time with patient today and reviewing patient's previous imaging including MRI 31 minutes

## 2023-01-24 NOTE — Patient Instructions (Addendum)
Injection in shoulder today Ice 20 minutes 2 times daily. Usually after activity and before bed. Will refer you to Rheumatology just to make sure not missing anything  See me again in 3 mon ths otherwise

## 2023-02-04 NOTE — Telephone Encounter (Signed)
Prior Auth initiated via [employer]  Prior Auth (EOC) ID:  [id number]

## 2023-02-08 ENCOUNTER — Ambulatory Visit: Payer: Commercial Managed Care - HMO

## 2023-02-09 NOTE — Telephone Encounter (Addendum)
Pt ready for scheduling on or after 02/08/23  Out-of-pocket cost due at time of visit: $756  Primary: [employer] HMO Prolia co-insurance: 50% Admin fee co-insurance: 50%  Deductible: does not apply  Prior Auth: APPROVED Prior Auth (EOC) ID: [id number]  Valid: 02/04/23-02/03/24    Secondary: [employer] co-insurance:  Admin fee co-insurance:   Deductible: n/a  Prior Auth: n/a PA# Valid:     ** This summary of benefits is an estimation of the patient's out-of-pocket cost. Exact cost may very based on individual plan coverage.

## 2023-02-09 NOTE — Telephone Encounter (Signed)
APPROVED

## 2023-02-10 ENCOUNTER — Ambulatory Visit (INDEPENDENT_AMBULATORY_CARE_PROVIDER_SITE_OTHER): Payer: Commercial Managed Care - HMO

## 2023-02-10 VITALS — BP 110/60 | Ht 65.0 in | Wt 119.0 lb

## 2023-02-10 DIAGNOSIS — M818 Other osteoporosis without current pathological fracture: Secondary | ICD-10-CM | POA: Diagnosis not present

## 2023-02-10 MED ORDER — DENOSUMAB 60 MG/ML ~~LOC~~ SOSY
60.0000 mg | PREFILLED_SYRINGE | Freq: Once | SUBCUTANEOUS | Status: AC
  Administered 2023-02-10: 60 mg via SUBCUTANEOUS

## 2023-02-10 NOTE — Progress Notes (Signed)
Patient verbally confirmed name, date of birth, and correct medication to be administered. Prolia injection administered and pt tolerated well.  

## 2023-02-28 NOTE — Telephone Encounter (Signed)
Last Prolia inj 02/10/23 Next Prolia inj due 08/14/23

## 2023-03-13 ENCOUNTER — Ambulatory Visit
Admission: RE | Admit: 2023-03-13 | Discharge: 2023-03-13 | Disposition: A | Discharge status: Home / Self Care | Payer: Commercial Managed Care - HMO | Source: Ambulatory Visit | Attending: Obstetrics and Gynecology | Admitting: Obstetrics and Gynecology

## 2023-03-13 DIAGNOSIS — Z1231 Encounter for screening mammogram for malignant neoplasm of breast: Secondary | ICD-10-CM

## 2023-03-14 ENCOUNTER — Telehealth: Payer: Self-pay

## 2023-03-14 NOTE — Telephone Encounter (Signed)
Pt called with questions regarding her prolia bill she received. Pt had questions about [employer] or something that can assist with the cost of mediation.

## 2023-03-15 NOTE — Telephone Encounter (Signed)
It looks like pt is enrolled in the [employer] copay assistance program. She should be able to upload her EOB from her insurance company and then they will load the funds to the copay card to use to pay for Prolia.

## 2023-03-16 NOTE — Telephone Encounter (Signed)
Pt contacted and advised It looks like pt is enrolled in the [employer] copay assistance program. She should be able to upload her EOB from her insurance company and then they will load the funds to the copay card to use to pay for Prolia.

## 2023-03-17 ENCOUNTER — Other Ambulatory Visit (INDEPENDENT_AMBULATORY_CARE_PROVIDER_SITE_OTHER): Payer: Commercial Managed Care - HMO

## 2023-03-17 DIAGNOSIS — Z8639 Personal history of other endocrine, nutritional and metabolic disease: Secondary | ICD-10-CM | POA: Diagnosis not present

## 2023-03-17 LAB — TSH: TSH: 2.27 u[IU]/mL (ref 0.35–5.50)

## 2023-03-17 LAB — T4, FREE: Free T4: 1.03 ng/dL (ref 0.60–1.60)

## 2023-04-03 ENCOUNTER — Other Ambulatory Visit: Payer: Self-pay | Admitting: Family Medicine

## 2023-04-03 DIAGNOSIS — F419 Anxiety disorder, unspecified: Secondary | ICD-10-CM

## 2023-04-21 NOTE — Progress Notes (Unsigned)
[facility] [address] [place] [address] Phone: [phone] Subjective:   Sophia [name], am serving as a scribe for Dr. Antoine Primas.  I'm seeing this patient by the request  of:  Karie Georges, MD  CC: Left shoulder follow-up  [id number]  01/24/2023 Chronic problem with worsening symptoms. Discussed again about the possibility of other alternative treatments such as PRP or surgical intervention. Patient wants to avoid certain activities, home exercises, icing regimen. Discussed low back could be beneficial. Patient is concerned secondary to frequency of steroids and discussed when to contact keep this from every 10 to 12 weeks if necessary. Could alternate with also Toradol injections if patient would like. Follow-up again in 8 to 12 weeks total time with patient today and reviewing patient's previous imaging including MRI 31 minutes   Update 04/24/2023 Sophia [name] is a 67 y.o. female coming in with complaint of L shoulder pain. Patient states that her pain is worsening. Having hard time performing full flexion, extension and IR.   Also c/o lower back pain. Was going to [facility] but discontinued going due to pain 3 months ago. Pain more on the R side. Believes pushing on machines irritated her back.       Past Medical History:  Diagnosis Date   Hypothyroidism    Past Surgical History:  Procedure Laterality Date   BLADDER INSTILLATION     2007,2012,2018 botox   BREAST CYST EXCISION Bilateral 1985, 1995   both benign, 1 on each breast   BREAST SURGERY     1985-right, mid 1990s-left benign (fibrocystic breast disease)   CATARACT EXTRACTION Left 2013   EYE SURGERY Left    done for floaters   INGUINAL HERNIA REPAIR Left 1986   LASIK Bilateral    2000   Social History   Socioeconomic History   Marital status: Married    Spouse name: Not on file   Number of children: Not on file   Years of education: Not on file    Highest education level: Not on file  Occupational History   Not on file  Tobacco Use   Smoking status: Never   Smokeless tobacco: Never  Vaping Use   Vaping Use: Never used  Substance and Sexual Activity   Alcohol use: Yes    Comment: occasionally   Drug use: Not on file   Sexual activity: Not on file  Other Topics Concern   Not on file  Social History Narrative   Not on file   Social Determinants of Health   Financial Resource Strain: Not on file  Food Insecurity: Not on file  Transportation Needs: Not on file  Physical Activity: Not on file  Stress: Not on file  Social Connections: Not on file   Allergies  Allergen Reactions   Sulfa Antibiotics Hives   Family History  Problem Relation Age of Onset   Arthritis Mother    Hypertension Mother    Hypertension Father    Prostate cancer Father 48   Basal cell carcinoma Father    Hypertension Sister    Colon polyps Sister    Diabetes Mellitus II Maternal Grandmother    Uterine cancer Maternal Grandmother        ? not certain   Colon polyps Sister    Colon cancer Neg Hx    Esophageal cancer Neg Hx    Pancreatic cancer Neg Hx     Current Outpatient Medications (Endocrine & Metabolic):  denosumab (PROLIA) 60 MG/ML SOSY injection, INJECT 60 MG UNDER THE SKIN ONCE FOR 1 DOSE   levothyroxine (SYNTHROID) 25 MCG tablet, Take 1 tablet (25 mcg total) by mouth daily.      Current Outpatient Medications (Other):    Cholecalciferol (VITAMIN D3 PO), Take 2,000 Units by mouth daily.   escitalopram (LEXAPRO) 10 MG tablet, Take 1 tablet by mouth once daily   GEMTESA 75 MG TABS, Take 1 tablet by mouth daily.   MAGNESIUM PO, Take 200 mg by mouth daily.   OVER THE COUNTER MEDICATION, Immunity support 1000mg  daily   OVER THE COUNTER MEDICATION, Turmeric/curcumin 1000mg  daily   OVER THE COUNTER MEDICATION, Methyl B-12 and Methyl Folate(plus P-5-P/B-6) once a day   Probiotic Product (PROBIOTIC DAILY PO), Take by  mouth.   Reviewed prior external information including notes and imaging from  primary care provider As well as notes that were available from care everywhere and other healthcare systems.  Past medical history, social, surgical and family history all reviewed in electronic medical record.  No pertanent information unless stated regarding to the chief complaint.   Review of Systems:  No headache, visual changes, nausea, vomiting, diarrhea, constipation, dizziness, abdominal pain, skin rash, fevers, chills, night sweats, weight loss, swollen lymph nodes, body aches, joint swelling, chest pain, shortness of breath, mood changes. POSITIVE muscle aches  Objective  Blood pressure 102/62, pulse 74, height 5\' 5"  (1.651 m), weight 117 lb (53.1 kg).   General: No apparent distress alert and oriented x3 mood and affect normal, dressed appropriately.  HEENT: Pupils equal, extraocular movements intact  Respiratory: Patient's speak in full sentences and does not appear short of breath  Cardiovascular: No lower extremity edema, non tender, no erythema  Left shoulder exam does have some limited range of motion noted.  Patient does have some crepitus noted.  Some difficulty with internal rotation noted rotator cuff strength 4 out of 5.  Procedure: Real-time Ultrasound Guided Injection of left glenohumeral joint Device: GE Logiq E  Ultrasound guided injection is preferred based studies that show increased duration, increased effect, greater accuracy, decreased procedural pain, increased response rate with ultrasound guided versus blind injection.  Verbal informed consent obtained.  Time-out conducted.  Noted no overlying erythema, induration, or other signs of local infection.  Skin prepped in a sterile fashion.  Local anesthesia: Topical Ethyl chloride.  With sterile technique and under real time ultrasound guidance:  Joint visualized.  21g 2 inch needle inserted posterior approach. Pictures taken for  needle placement. Patient did have injection of 2 cc of 0.5% Marcaine, and 1cc of Kenalog 40 mg/dL. Completed without difficulty  Pain immediately resolved suggesting accurate placement of the medication.  Advised to call if fevers/chills, erythema, induration, drainage, or persistent bleeding.  Images permanently stored and available for review in the ultrasound unit.  Impression: Technically successful ultrasound guided injection.    Impression and Recommendations:    The above documentation has been reviewed and is accurate and complete Judi Saa, DO

## 2023-04-24 ENCOUNTER — Ambulatory Visit (INDEPENDENT_AMBULATORY_CARE_PROVIDER_SITE_OTHER): Payer: BC Managed Care – PPO | Admitting: Family Medicine

## 2023-04-24 ENCOUNTER — Encounter: Payer: Self-pay | Admitting: Family Medicine

## 2023-04-24 ENCOUNTER — Other Ambulatory Visit: Payer: BC Managed Care – PPO

## 2023-04-24 ENCOUNTER — Other Ambulatory Visit: Payer: Self-pay | Admitting: Obstetrics and Gynecology

## 2023-04-24 VITALS — BP 102/62 | HR 74 | Ht 65.0 in | Wt 117.0 lb

## 2023-04-24 DIAGNOSIS — R922 Inconclusive mammogram: Secondary | ICD-10-CM

## 2023-04-24 DIAGNOSIS — G8929 Other chronic pain: Secondary | ICD-10-CM | POA: Diagnosis not present

## 2023-04-24 DIAGNOSIS — M25412 Effusion, left shoulder: Secondary | ICD-10-CM

## 2023-04-24 DIAGNOSIS — M25512 Pain in left shoulder: Secondary | ICD-10-CM | POA: Diagnosis not present

## 2023-04-24 NOTE — Assessment & Plan Note (Signed)
Discussed with patient at great length.  Discussed the arthritic changes noted.  Moderate to severe and with patient's osteoporosis does need to strengthen bones.  We discussed patient is scheduled for a bone density in August.  Depending on the findings and patient's response to the Prolia we will see how she is doing.  May need surgical intervention for the shoulder at some point but hopefully injections every 3 months could be helpful.  Given handout to discuss PRP for this chronic worsening symptoms as well.  Follow-up again 3 months

## 2023-04-24 NOTE — Patient Instructions (Signed)
Injected shoulder today Read about PRP See me again in 3 months

## 2023-06-07 ENCOUNTER — Telehealth: Payer: Self-pay

## 2023-06-07 DIAGNOSIS — Z8639 Personal history of other endocrine, nutritional and metabolic disease: Secondary | ICD-10-CM

## 2023-06-07 NOTE — Telephone Encounter (Signed)
Patient has a lab appt 06/08/23 and needs orders. OK to order the same ones from April?

## 2023-06-08 ENCOUNTER — Other Ambulatory Visit: Payer: Self-pay | Admitting: Internal Medicine

## 2023-06-08 ENCOUNTER — Other Ambulatory Visit (INDEPENDENT_AMBULATORY_CARE_PROVIDER_SITE_OTHER): Payer: BC Managed Care – PPO

## 2023-06-08 DIAGNOSIS — Z8639 Personal history of other endocrine, nutritional and metabolic disease: Secondary | ICD-10-CM

## 2023-06-08 LAB — T4, FREE: Free T4: 0.93 ng/dL (ref 0.60–1.60)

## 2023-06-08 LAB — TSH: TSH: 3.9 u[IU]/mL (ref 0.35–5.50)

## 2023-06-08 NOTE — Telephone Encounter (Signed)
She does not need a repeat set of TFTs.  We can recheck them at next visit, as mentioned in the previous message to her.

## 2023-06-08 NOTE — Telephone Encounter (Addendum)
Pt wanted to come since her next appt is not until September

## 2023-07-03 NOTE — Telephone Encounter (Signed)
Prolia VOB initiated via [email or web address]  Next Prolia inj DUE: 08/14/23

## 2023-07-08 NOTE — Telephone Encounter (Signed)
Insurance updated from [place] to [place].   VOB re-submitted

## 2023-07-14 ENCOUNTER — Encounter: Payer: Self-pay | Admitting: Internal Medicine

## 2023-07-14 ENCOUNTER — Ambulatory Visit
Admission: RE | Admit: 2023-07-14 | Discharge: 2023-07-14 | Disposition: A | Discharge status: Home / Self Care | Payer: BC Managed Care – PPO | Source: Ambulatory Visit | Attending: Internal Medicine | Admitting: Internal Medicine

## 2023-07-14 DIAGNOSIS — M818 Other osteoporosis without current pathological fracture: Secondary | ICD-10-CM

## 2023-07-14 DIAGNOSIS — N958 Other specified menopausal and perimenopausal disorders: Secondary | ICD-10-CM | POA: Diagnosis not present

## 2023-07-14 DIAGNOSIS — E349 Endocrine disorder, unspecified: Secondary | ICD-10-CM | POA: Diagnosis not present

## 2023-07-14 DIAGNOSIS — M8588 Other specified disorders of bone density and structure, other site: Secondary | ICD-10-CM | POA: Diagnosis not present

## 2023-07-21 NOTE — Telephone Encounter (Signed)
Prior auth initiated for PROLIA via [employer], MAGNER  FEMALE  67 years  Reference # [id number]  (Pended)

## 2023-07-21 NOTE — Telephone Encounter (Signed)
Prior Auth required for [employer]  PA PROCESS DETAILS: PA is required. Providers may call Medical Utilization at [phone] to initiate. Forms may be accessed online at [email or web address] & faxed back to [phone] .    [Your commercially insured patient may be eligible for the [[employer] Support[employer] Co-Pay Program. Please visit [email or web address] for more information? ] [For information on resources, please have your patient contact [employer] Support[employer] Co-Pay Program at [phone].]

## 2023-07-26 ENCOUNTER — Ambulatory Visit: Payer: BC Managed Care – PPO | Admitting: Family Medicine

## 2023-07-26 ENCOUNTER — Encounter: Payer: Self-pay | Admitting: Family Medicine

## 2023-07-26 VITALS — BP 96/62 | Ht 65.0 in | Wt 113.0 lb

## 2023-07-26 DIAGNOSIS — M25512 Pain in left shoulder: Secondary | ICD-10-CM | POA: Diagnosis not present

## 2023-07-26 NOTE — Assessment & Plan Note (Signed)
Doing significantly well at this time.  Hold on any other type of injection.  Discussed icing regimen.  Continue to monitor.  Follow-up with me again in 3 months

## 2023-07-26 NOTE — Progress Notes (Signed)
[facility] [address] [place] [address] Phone: [phone] Subjective:   Sophia [name], am serving as a scribe for Dr. Antoine Primas.  I'm seeing this patient by the request  of:  Karie Georges, MD  CC: left shoulder shows   [id number]  04/24/2023 Discussed with patient at great length. Discussed the arthritic changes noted. Moderate to severe and with patient's osteoporosis does need to strengthen bones. We discussed patient is scheduled for a bone density in August. Depending on the findings and patient's response to the Prolia we will see how she is doing. May need surgical intervention for the shoulder at some point but hopefully injections every 3 months could be helpful. Given handout to discuss PRP for this chronic worsening symptoms as well. Follow-up again 3 months   Updated 07/26/2023 Sophia [name] is a 67 y.o. female coming in with complaint of L shoulder pain. Shoulder feels okay. No injection needed. Wants to go over bone density.  Patient did bring in the bone density patient has shown improvement since she has been on the Prolia.     Past Medical History:  Diagnosis Date   Hypothyroidism    Past Surgical History:  Procedure Laterality Date   BLADDER INSTILLATION     2007,2012,2018 botox   BREAST CYST EXCISION Bilateral 1985, 1995   both benign, 1 on each breast   BREAST SURGERY     1985-right, mid 1990s-left benign (fibrocystic breast disease)   CATARACT EXTRACTION Left 2013   EYE SURGERY Left    done for floaters   INGUINAL HERNIA REPAIR Left 1986   LASIK Bilateral    2000   Social History   Socioeconomic History   Marital status: Married    Spouse name: Not on file   Number of children: Not on file   Years of education: Not on file   Highest education level: Not on file  Occupational History   Not on file  Tobacco Use   Smoking status: Never   Smokeless tobacco: Never  Vaping Use   Vaping  status: Never Used  Substance and Sexual Activity   Alcohol use: Yes    Comment: occasionally   Drug use: Not on file   Sexual activity: Not on file  Other Topics Concern   Not on file  Social History Narrative   Not on file   Social Determinants of Health   Financial Resource Strain: Not on file  Food Insecurity: Not on file  Transportation Needs: Not on file  Physical Activity: Not on file  Stress: Not on file  Social Connections: Not on file   Allergies  Allergen Reactions   Sulfa Antibiotics Hives   Family History  Problem Relation Age of Onset   Arthritis Mother    Hypertension Mother    Hypertension Father    Prostate cancer Father 43   Basal cell carcinoma Father    Hypertension Sister    Colon polyps Sister    Diabetes Mellitus II Maternal Grandmother    Uterine cancer Maternal Grandmother        ? not certain   Colon polyps Sister    Colon cancer Neg Hx    Esophageal cancer Neg Hx    Pancreatic cancer Neg Hx     Current Outpatient Medications (Endocrine & Metabolic):    denosumab (PROLIA) 60 MG/ML SOSY injection, INJECT 60 MG UNDER THE SKIN ONCE FOR 1 DOSE   levothyroxine (SYNTHROID) 25 MCG tablet,  Take 1 tablet (25 mcg total) by mouth daily.      Current Outpatient Medications (Other):    Cholecalciferol (VITAMIN D3 PO), Take 2,000 Units by mouth daily.   escitalopram (LEXAPRO) 10 MG tablet, Take 1 tablet by mouth once daily   GEMTESA 75 MG TABS, Take 1 tablet by mouth daily.   MAGNESIUM PO, Take 200 mg by mouth daily.   OVER THE COUNTER MEDICATION, Immunity support 1000mg  daily   OVER THE COUNTER MEDICATION, Turmeric/curcumin 1000mg  daily   OVER THE COUNTER MEDICATION, Methyl B-12 and Methyl Folate(plus P-5-P/B-6) once a day   Probiotic Product (PROBIOTIC DAILY PO), Take by mouth.   Reviewed prior external information including notes and imaging from  primary care provider As well as notes that were available from care  everywhere and other healthcare systems.  Past medical history, social, surgical and family history all reviewed in electronic medical record.  No pertanent information unless stated regarding to the chief complaint.   Review of Systems:  No headache, visual changes, nausea, vomiting, diarrhea, constipation, dizziness, abdominal pain, skin rash, fevers, chills, night sweats, weight loss, swollen lymph nodes, body aches, joint swelling, chest pain, shortness of breath, mood changes. POSITIVE muscle aches  Objective  Blood pressure 96/62, height 5\' 5"  (1.651 m), weight 113 lb (51.3 kg), SpO2 96%.   General: No apparent distress alert and oriented x3 mood and affect normal, dressed appropriately.  HEENT: Pupils equal, extraocular movements intact  Respiratory: Patient's speak in full sentences and does not appear short of breath  Cardiovascular: No lower extremity edema, non tender, no erythema  Left shoulder exam shows patient does have motion fairly significantly.  Still has some crepitus.  Still has some weakness no the rotator cuff compared to the contralateral side.      Impression and Recommendations:    The above documentation has been reviewed and is accurate and complete Judi Saa, DO

## 2023-07-26 NOTE — Patient Instructions (Signed)
Good to see you! Make no changes Glad you're doing so great See you again in 3 months

## 2023-08-10 NOTE — Telephone Encounter (Signed)
Per [employer]   (07/21/2023 13:26 EDT by Joslyn Hy:) This request is being cancelled as it has been loaded in incorrect portal for medical drugs. Effective 12/15/2022, , providers should submit all medical drug requests through the [id number] portal instead of through CoverMyMeds. You must load a new request for these services. If you are in-network on this member's plan, please load request in [facility] which can be accessed via the portal located in [place] or Fax: [phone]. If you are out-of-network on this member's plan, please download form at [email or web address] and fax to Fax: [phone].

## 2023-08-12 NOTE — Telephone Encounter (Signed)
Prior Auth initiated for PROLIA via [employer] [facility] portal.  Authorization Status: In Progress  Reason: Pending Review  RX Case#: [id number]

## 2023-08-15 ENCOUNTER — Ambulatory Visit (INDEPENDENT_AMBULATORY_CARE_PROVIDER_SITE_OTHER): Payer: BC Managed Care – PPO

## 2023-08-15 VITALS — BP 120/60 | HR 89 | Ht 64.5 in | Wt 113.8 lb

## 2023-08-15 DIAGNOSIS — M810 Age-related osteoporosis without current pathological fracture: Secondary | ICD-10-CM | POA: Diagnosis not present

## 2023-08-15 MED ORDER — DENOSUMAB 60 MG/ML ~~LOC~~ SOSY
60.0000 mg | PREFILLED_SYRINGE | Freq: Once | SUBCUTANEOUS | Status: AC
Start: 2023-08-15 — End: 2023-08-15
  Administered 2023-08-15: 60 mg via SUBCUTANEOUS

## 2023-08-15 NOTE — Progress Notes (Signed)
After obtaining consent, and per orders of Dr. Elvera Lennox, injection of Prolia 60 mg given by Pollie Meyer. Patient instructed to remain in clinic for 20 minutes afterwards, and to report any adverse reaction to me immediately.

## 2023-08-18 NOTE — Telephone Encounter (Signed)
Prior Auth for Prolia APPROVED PA#  Valid: 08/10/23-08/09/24   [employer] Protal benefits: COVERAGE DETAILS: Prolia and administration will be covered at 100%. No deductible, coinsurance or out of pocket max applies. The benefits provided on this Verification of Benefits form are Medical Benefits and are the patient's In-Network benefits for Prolia. If you would like Pharmacy Benefits for Prolia, please call [phone]

## 2023-08-18 NOTE — Telephone Encounter (Signed)
Pt ready for scheduling on or after 08/10/23  Out-of-pocket cost due at time of visit: $0  Primary:: [employer] [place] [employer] Prolia co-insurance: 0% Admin fee co-insurance: 0%  Deductible: does not apply  Prior Auth: APPROVED PA# [id number] Valid: Valid: 08/10/23-08/09/24   Secondary: N/A Prolia co-insurance:  Admin fee co-insurance:  Deductible: Prior Auth:      ** This summary of benefits is an estimation of the patient's out-of-pocket cost. Exact cost may very based on individual plan coverage.

## 2023-08-18 NOTE — Telephone Encounter (Signed)
Last Prolia inj 08/15/23 Next Prolia inj due 02/13/24

## 2023-09-05 ENCOUNTER — Encounter: Payer: Self-pay | Admitting: Family Medicine

## 2023-09-05 ENCOUNTER — Other Ambulatory Visit (INDEPENDENT_AMBULATORY_CARE_PROVIDER_SITE_OTHER): Payer: BC Managed Care – PPO

## 2023-09-05 ENCOUNTER — Ambulatory Visit (INDEPENDENT_AMBULATORY_CARE_PROVIDER_SITE_OTHER): Payer: BC Managed Care – PPO | Admitting: Family Medicine

## 2023-09-05 VITALS — BP 98/62 | HR 77 | Temp 97.7°F | Ht 65.5 in | Wt 114.2 lb

## 2023-09-05 DIAGNOSIS — M818 Other osteoporosis without current pathological fracture: Secondary | ICD-10-CM | POA: Diagnosis not present

## 2023-09-05 DIAGNOSIS — Z0000 Encounter for general adult medical examination without abnormal findings: Secondary | ICD-10-CM

## 2023-09-05 DIAGNOSIS — Z13220 Encounter for screening for lipoid disorders: Secondary | ICD-10-CM

## 2023-09-05 DIAGNOSIS — Z23 Encounter for immunization: Secondary | ICD-10-CM | POA: Diagnosis not present

## 2023-09-05 LAB — LIPID PANEL
Cholesterol: 191 mg/dL (ref 0–200)
HDL: 69.9 mg/dL (ref >39.00)
LDL Cholesterol: 108 mg/dL — ABNORMAL HIGH (ref 0–99)
NonHDL: 121.13
Total CHOL/HDL Ratio: 3
Triglycerides: 64 mg/dL (ref 0.0–149.0)
VLDL: 12.8 mg/dL (ref 0.0–40.0)

## 2023-09-05 LAB — CBC WITH DIFFERENTIAL/PLATELET
Basophils Absolute: 0.1 10*3/uL (ref 0.0–0.1)
Basophils Relative: 1.3 % (ref 0.0–3.0)
Eosinophils Absolute: 0.2 10*3/uL (ref 0.0–0.7)
Eosinophils Relative: 3.5 % (ref 0.0–5.0)
HCT: 42.3 % (ref 36.0–46.0)
Hemoglobin: 13.4 g/dL (ref 12.0–15.0)
Lymphocytes Relative: 29.5 % (ref 12.0–46.0)
Lymphs Abs: 1.9 10*3/uL (ref 0.7–4.0)
MCHC: 31.8 g/dL (ref 30.0–36.0)
MCV: 90.5 fL (ref 78.0–100.0)
Monocytes Absolute: 0.5 10*3/uL (ref 0.1–1.0)
Monocytes Relative: 8.3 % (ref 3.0–12.0)
Neutro Abs: 3.7 10*3/uL (ref 1.4–7.7)
Neutrophils Relative %: 57.4 % (ref 43.0–77.0)
Platelets: 277 10*3/uL (ref 150.0–400.0)
RBC: 4.68 Mil/uL (ref 3.87–5.11)
RDW: 14.4 % (ref 11.5–15.5)
WBC: 6.4 10*3/uL (ref 4.0–10.5)

## 2023-09-05 LAB — COMPREHENSIVE METABOLIC PANEL
ALT: 17 U/L (ref 0–35)
AST: 22 U/L (ref 0–37)
Albumin: 4.3 g/dL (ref 3.5–5.2)
Alkaline Phosphatase: 31 U/L — ABNORMAL LOW (ref 39–117)
BUN: 12 mg/dL (ref 6–23)
CO2: 29 meq/L (ref 19–32)
Calcium: 9.7 mg/dL (ref 8.4–10.5)
Chloride: 104 meq/L (ref 96–112)
Creatinine, Ser: 0.93 mg/dL (ref 0.40–1.20)
GFR: 64.66 mL/min (ref >60.00)
Glucose, Bld: 84 mg/dL (ref 70–99)
Potassium: 4.8 meq/L (ref 3.5–5.1)
Sodium: 140 meq/L (ref 135–145)
Total Bilirubin: 0.4 mg/dL (ref 0.2–1.2)
Total Protein: 7.1 g/dL (ref 6.0–8.3)

## 2023-09-05 LAB — VITAMIN D 25 HYDROXY (VIT D DEFICIENCY, FRACTURES): VITD: 57.56 ng/mL (ref 30.00–100.00)

## 2023-09-05 NOTE — Progress Notes (Signed)
Complete physical exam  Patient: Sophia [name]   DOB: Dec 26, 1955   67 y.o. Female  MRN: [id number]  Subjective:    Chief Complaint  Patient presents with   Annual Exam    Sophia [name] is a 67 y.o. female who presents today for a complete physical exam. She reports consuming a general diet.  Jogging twice a week.  She generally feels well. She reports sleeping well. She does not have additional problems to discuss today.    Most recent fall risk assessment:    09/05/2023    8:07 AM  Fall Risk   Falls in the past year? 0  Number falls in past yr: 0  Injury with Fall? 0  Risk for fall due to : No Fall Risks  Follow up Falls evaluation completed     Most recent depression screenings:    09/05/2023    8:07 AM 09/02/2022    1:33 PM  PHQ 2/9 Scores  PHQ - 2 Score 0 0  PHQ- 9 Score  0   Vision Screening   Right eye Left eye Both eyes  Without correction     With correction 20/13 20/13     Vision:Within last year and Dental: No current dental problems and Receives regular dental care  Patient Active Problem List   Diagnosis Date Noted   Greater trochanteric bursitis of left hip 10/26/2022   Synovitis of hand 10/26/2022   Routine history and physical examination of adult 09/07/2022   Acute sinusitis 08/29/2022   Effusion of joint of left shoulder 06/15/2022   AC joint pain 06/15/2022   Ganglion and cyst of synovium, tendon and bursa 07/03/2021   Loss of transverse plantar arch 07/03/2021   Metatarsalgia of right foot 04/26/2021   Pain in left foot 04/26/2021   Insomnia 03/19/2021   Hypothyroid 08/31/2020   Osteoporosis 03/01/2020   Interstitial cystitis 03/01/2020   Basal cell carcinoma 03/01/2020   Bunion 12/25/2019      Patient Care Team: Karie Georges, MD as PCP - General (Family Medicine) Huel Cote, MD as Consulting Physician (Obstetrics and Gynecology) Doy Hutching, [place] (Physician Assistant) Sallye Lat, MD as  Consulting Physician (Ophthalmology)   Outpatient Medications Prior to Visit  Medication Sig   Cholecalciferol (VITAMIN D3 PO) Take 2,000 Units by mouth daily.   denosumab (PROLIA) 60 MG/ML SOSY injection INJECT 60 MG UNDER THE SKIN ONCE FOR 1 DOSE   GEMTESA 75 MG TABS Take 1 tablet by mouth daily.   levothyroxine (SYNTHROID) 25 MCG tablet Take 1 tablet (25 mcg total) by mouth daily.   MAGNESIUM PO Take 200 mg by mouth daily.   OVER THE COUNTER MEDICATION Immunity support 1000mg  daily   OVER THE COUNTER MEDICATION Turmeric/curcumin 1000mg  daily   OVER THE COUNTER MEDICATION Methyl B-12 and Methyl Folate(plus P-5-P/B-6) once a day   Probiotic Product (PROBIOTIC DAILY PO) Take by mouth.   [DISCONTINUED] escitalopram (LEXAPRO) 10 MG tablet Take 1 tablet by mouth once daily   No facility-administered medications prior to visit.    Review of Systems  HENT:  Negative for hearing loss.   Eyes:  Negative for blurred vision.  Respiratory:  Negative for shortness of breath.   Cardiovascular:  Negative for chest pain.  Gastrointestinal: Negative.   Genitourinary: Negative.   Musculoskeletal:  Negative for back pain.  Neurological:  Negative for headaches.  Psychiatric/Behavioral:  Negative for depression.        Objective:  BP 98/62 (BP Location: Left Arm, Patient Position: Sitting, Cuff Size: Normal)   Pulse 77   Temp 97.7 F (36.5 C) (Oral)   Ht 5' 5.5" (1.664 m)   Wt 114 lb 3.2 oz (51.8 kg)   SpO2 99%   BMI 18.71 kg/m    Physical Exam Vitals reviewed.  Constitutional:      Appearance: Normal appearance. She is well-groomed and normal weight.  HENT:     Right Ear: Tympanic membrane and ear canal normal.     Left Ear: Tympanic membrane and ear canal normal.     Mouth/Throat:     Mouth: Mucous membranes are moist.     Pharynx: No posterior oropharyngeal erythema.  Eyes:     Conjunctiva/sclera: Conjunctivae normal.  Neck:     Thyroid: No thyromegaly.   Cardiovascular:     Rate and Rhythm: Normal rate and regular rhythm.     Pulses: Normal pulses.     Heart sounds: S1 normal and S2 normal.  Pulmonary:     Effort: Pulmonary effort is normal.     Breath sounds: Normal breath sounds and air entry.  Abdominal:     General: Abdomen is flat. Bowel sounds are normal.     Palpations: Abdomen is soft.  Musculoskeletal:     Right lower leg: No edema.     Left lower leg: No edema.  Lymphadenopathy:     Cervical: No cervical adenopathy.  Neurological:     Mental Status: She is alert and oriented to person, place, and time. Mental status is at baseline.     Gait: Gait is intact.  Psychiatric:        Mood and Affect: Mood and affect normal.        Speech: Speech normal.        Behavior: Behavior normal.        Judgment: Judgment normal.      No results found for any visits on 09/05/23.     Assessment & Plan:    Routine Health Maintenance and Physical Exam  Immunization History  Administered Date(s) Administered   Influenza,inj,Quad PF,6+ Mos 08/29/2021   Influenza-Unspecified 08/07/2019, 08/11/2020, 08/11/2022, 08/19/2023   PFIZER(Purple Top)SARS-COV-2 Vaccination 12/28/2019, 01/18/2020, 08/25/2020, 03/06/2021, 08/12/2022   Pfizer Covid-19 Vaccine Bivalent Booster 47yrs & up 08/12/2021   Td 04/01/2003   Tdap 08/07/2018   Zoster Recombinant(Shingrix) 12/26/2016, 04/10/2017    Health Maintenance  Topic Date Due   Cervical Cancer Screening (HPV/Pap Cotest)  Never done   Pneumonia Vaccine 84+ Years old (1 of 1 - PCV) Never done   COVID-19 Vaccine (7 - 2023-24 season) 09/21/2023 (Originally 07/23/2023)   HIV Screening  09/04/2024 (Originally 07/11/1971)   MAMMOGRAM  03/12/2025   Colonoscopy  05/07/2026   DTaP/Tdap/Td (3 - Td or Tdap) 08/07/2028   INFLUENZA VACCINE  Completed   DEXA SCAN  Completed   Hepatitis C Screening  Completed   Zoster Vaccines- Shingrix  Completed   HPV VACCINES  Aged Out    Discussed health benefits of  physical activity, and encouraged her to engage in regular exercise appropriate for her age and condition.  Lipid screening -     Lipid panel; Future  Immunization due -     Pneumococcal conjugate vaccine 20-valent  Other osteoporosis without current pathological fracture -     VITAMIN D 25 Hydroxy (Vit-D Deficiency, Fractures); Future  Routine general medical examination at a health care facility -     Comprehensive metabolic panel; Future -  CBC with Differential/Platelet; Future  Normal physical exam findings today, she is doing very well with exercising daily and eating healthy. Handouts given on these topics. Counseled patient on the Prevnar -20 vaccination which she is due for today. Checking annual labs for surveillance, she continues on prolia and a small dose of thyroid medication, med list was reviewed, she has stopped the lexapro and seems to be doing well without this medication.   Return in 1 year (on 09/04/2024).     Karie Georges, MD

## 2023-09-05 NOTE — Patient Instructions (Signed)

## 2023-09-06 ENCOUNTER — Encounter: Payer: Self-pay | Admitting: Family Medicine

## 2023-09-12 ENCOUNTER — Ambulatory Visit
Admission: RE | Admit: 2023-09-12 | Discharge: 2023-09-12 | Disposition: A | Discharge status: Home / Self Care | Payer: BC Managed Care – PPO | Source: Ambulatory Visit | Attending: Obstetrics and Gynecology | Admitting: Obstetrics and Gynecology

## 2023-09-12 DIAGNOSIS — R922 Inconclusive mammogram: Secondary | ICD-10-CM

## 2023-09-12 DIAGNOSIS — Z1239 Encounter for other screening for malignant neoplasm of breast: Secondary | ICD-10-CM | POA: Diagnosis not present

## 2023-09-12 DIAGNOSIS — R92323 Mammographic fibroglandular density, bilateral breasts: Secondary | ICD-10-CM | POA: Diagnosis not present

## 2023-09-12 MED ORDER — GADOPICLENOL 0.5 MMOL/ML IV SOLN
5.0000 mL | Freq: Once | INTRAVENOUS | Status: AC | PRN
  Administered 2023-09-12: 5 mL via INTRAVENOUS

## 2023-09-18 ENCOUNTER — Other Ambulatory Visit: Payer: Self-pay | Admitting: Internal Medicine

## 2023-10-24 NOTE — Progress Notes (Unsigned)
[facility] [address] [place] [address] Phone: [phone] Subjective:   Bruce Donath, am serving as a scribe for Dr. Antoine Primas.  I'm seeing this patient by the request  of:  Karie Georges, MD  CC: Left shoulder pain  [id number]  07/26/2023 Doing significantly well at this time.  Hold on any other type of injection.  Discussed icing regimen.  Continue to monitor.  Follow-up with me again in 3 months      Update 10/25/2023 Sophia [name] is a 67 y.o. female coming in with complaint of L AC jt pain. Patient states that she continues to have pain with IR. She does OsteoStrong that does cause increase in shoulder pain at time of workout. Does not need an injection.       Past Medical History:  Diagnosis Date   Hypothyroidism    Past Surgical History:  Procedure Laterality Date   BLADDER INSTILLATION     2007,2012,2018 botox   BREAST CYST EXCISION Bilateral 1985, 1995   both benign, 1 on each breast   BREAST SURGERY     1985-right, mid 1990s-left benign (fibrocystic breast disease)   CATARACT EXTRACTION Left 2013   EYE SURGERY Left    done for floaters   INGUINAL HERNIA REPAIR Left 1986   LASIK Bilateral    2000   Social History   Socioeconomic History   Marital status: Married    Spouse name: Not on file   Number of children: Not on file   Years of education: Not on file   Highest education level: Not on file  Occupational History   Not on file  Tobacco Use   Smoking status: Never   Smokeless tobacco: Never  Vaping Use   Vaping status: Never Used  Substance and Sexual Activity   Alcohol use: Yes    Comment: occasionally   Drug use: Not on file   Sexual activity: Not on file  Other Topics Concern   Not on file  Social History Narrative   Not on file   Social Determinants of Health   Financial Resource Strain: Not on file  Food Insecurity: Not on file  Transportation Needs: Not on file   Physical Activity: Not on file  Stress: Not on file  Social Connections: Not on file   Allergies  Allergen Reactions   Sulfa Antibiotics Hives   Family History  Problem Relation Age of Onset   Arthritis Mother    Hypertension Mother    Hypertension Father    Prostate cancer Father 13   Basal cell carcinoma Father    Hypertension Sister    Colon polyps Sister    Diabetes Mellitus II Maternal Grandmother    Uterine cancer Maternal Grandmother        ? not certain   Colon polyps Sister    Colon cancer Neg Hx    Esophageal cancer Neg Hx    Pancreatic cancer Neg Hx     Current Outpatient Medications (Endocrine & Metabolic):    denosumab (PROLIA) 60 MG/ML SOSY injection, INJECT 60 MG UNDER THE SKIN ONCE FOR 1 DOSE   levothyroxine (SYNTHROID) 25 MCG tablet, Take 1 tablet by mouth once daily    Current Outpatient Medications (Analgesics):    celecoxib (CELEBREX) 100 MG capsule, Take 1 capsule (100 mg total) by mouth 2 (two) times daily.   Current Outpatient Medications (Other):    Cholecalciferol (VITAMIN D3 PO), Take 2,000 Units by mouth daily.  GEMTESA 75 MG TABS, Take 1 tablet by mouth daily.   MAGNESIUM PO, Take 200 mg by mouth daily.   OVER THE COUNTER MEDICATION, Immunity support 1000mg  daily   OVER THE COUNTER MEDICATION, Turmeric/curcumin 1000mg  daily   OVER THE COUNTER MEDICATION, Methyl B-12 and Methyl Folate(plus P-5-P/B-6) once a day   Probiotic Product (PROBIOTIC DAILY PO), Take by mouth.    Objective  Blood pressure 120/78, pulse 63, height 5' 5.5" (1.664 m), weight 116 lb (52.6 kg), SpO2 98%.   General: No apparent distress alert and oriented x3 mood and affect normal, dressed appropriately.  HEENT: Pupils equal, extraocular movements intact  Respiratory: Patient's speak in full sentences and does not appear short of breath  Cardiovascular: No lower extremity edema, non tender, no erythema  Patient is sitting doing relatively well.  Does  have improvement in range of motion.  Still has tenderness to palpation in the paraspinal musculature otherwise.    Impression and Recommendations:     The above documentation has been reviewed and is accurate and complete Judi Saa, DO

## 2023-10-25 ENCOUNTER — Encounter: Payer: Self-pay | Admitting: Family Medicine

## 2023-10-25 ENCOUNTER — Ambulatory Visit: Payer: Self-pay

## 2023-10-25 ENCOUNTER — Ambulatory Visit: Payer: BC Managed Care – PPO | Admitting: Family Medicine

## 2023-10-25 VITALS — BP 120/78 | HR 63 | Ht 65.5 in | Wt 116.0 lb

## 2023-10-25 DIAGNOSIS — M25512 Pain in left shoulder: Secondary | ICD-10-CM | POA: Diagnosis not present

## 2023-10-25 MED ORDER — CELECOXIB 100 MG PO CAPS: 100.0000 mg | ORAL_CAPSULE | Freq: Two times a day (BID) | ORAL | 0 refills | Status: DC

## 2023-10-25 NOTE — Patient Instructions (Signed)
Celebrex 100mg  BID Stay active See me again in 3 months

## 2023-10-25 NOTE — Assessment & Plan Note (Addendum)
Patient has been doing relatively well overall.  We discussed with patient about which activities to do and which ones to avoid.  Increase activity slowly otherwise.  Follow-up with me again in 2 to 3 months.  Patient has responded previously to some anti-inflammatories and given some Celebrex.  Patient does have a history of sulfa allergy will watch for any type of rash and discontinue immediately.  Patient wanted to try this to see if it could potentially make improvement.

## 2023-11-01 DIAGNOSIS — Z961 Presence of intraocular lens: Secondary | ICD-10-CM | POA: Diagnosis not present

## 2023-11-01 DIAGNOSIS — H538 Other visual disturbances: Secondary | ICD-10-CM | POA: Diagnosis not present

## 2023-12-07 ENCOUNTER — Other Ambulatory Visit: Payer: Self-pay | Admitting: Obstetrics and Gynecology

## 2023-12-07 DIAGNOSIS — Z1231 Encounter for screening mammogram for malignant neoplasm of breast: Secondary | ICD-10-CM

## 2023-12-18 ENCOUNTER — Other Ambulatory Visit: Payer: Self-pay | Admitting: Internal Medicine

## 2024-01-12 ENCOUNTER — Ambulatory Visit: Payer: BC Managed Care – PPO | Admitting: Internal Medicine

## 2024-01-12 ENCOUNTER — Encounter: Payer: Self-pay | Admitting: Internal Medicine

## 2024-01-12 VITALS — BP 112/60 | HR 86 | Ht 65.5 in | Wt 114.6 lb

## 2024-01-12 DIAGNOSIS — M810 Age-related osteoporosis without current pathological fracture: Secondary | ICD-10-CM | POA: Diagnosis not present

## 2024-01-12 DIAGNOSIS — E039 Hypothyroidism, unspecified: Secondary | ICD-10-CM | POA: Diagnosis not present

## 2024-01-12 LAB — T4, FREE: Free T4: 1.3 ng/dL (ref 0.8–1.8)

## 2024-01-12 LAB — TSH: TSH: 3.75 m[IU]/L (ref 0.40–4.50)

## 2024-01-12 MED ORDER — LEVOTHYROXINE SODIUM 25 MCG PO TABS: 25.0000 ug | ORAL_TABLET | Freq: Every day | ORAL | 3 refills | Status: DC

## 2024-01-12 NOTE — Patient Instructions (Signed)
Please stop at the lab.  Please continue Levothyroxine 25 mcg daily.  Take the thyroid hormone every day, with water, at least 30 minutes before breakfast, separated by at least 4 hours from: - acid reflux medications - calcium - iron - multivitamins  Please return in 1 year.

## 2024-01-12 NOTE — Progress Notes (Addendum)
Patient ID: Sophia [name], female   DOB: [birth year], 68 y.o.   MRN: [id number]   HPI  Sophia KITTLESON is a 68 y.o.-year-old female, initially referred by Dr. Senaida Ores, returning for follow-up for osteoporosis, also history of hypothyroidism and high vitamin D. She lived in [place] and then in [place] in 07/2019.  She established care with [facility] in 02/2020.  Last visit 1 year ago.  Interim history: No falls or fractures since last visit She denies orthostasis, dizziness, vertigo, vision problems. She continues walking/running few miles a day. Before last visit she had steroid injections in left shoulder and left hip (bursitis).  No steroid injections since last visit.  Shoulder pain is better. 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  Osteoporosis: - Diagnosed in 2018.  Reviewed latest DXA scans: Date L1-L3 (L4) T score FN T score 33% distal Radius Ultra distal radius  07/14/2023 ([facility]) -1.9 (+6.4%*) RFN: -1.6 LFN: -1.6 (+1.6%) N/a N/a  06/10/2022 ([facility]) -2.1 (-3.4%*) RFN: -1.6 LFN: -2.1 -1.5 -3.2  06/09/2021 ([facility]) -2.2 RFN: -1.7 LFN: -1.6 N/a N/a  12/10/2019 (Breast center) -2.5 RFN: -1.6 LFN: -1.5 n/a N/a  10/10/2018 -2.2 RFN: -1.5 LFN: -1.7 n/a N/a  10/09/2017 -2.6 RFN: -1.5 LFN:-1.8 n/a N/a   No history of fractures. She did have 3 falls since moving to [place] (tripping), which she attributes to a particular pair of shoes.  Previous osteoporosis treatments: - Actonel - for 5 years, until 2018 - Reclast 10/2017, 10/2018, 10/06/2020 - Prolia- started 08/09/2022.  Subsequent doses: 02/10/2023 08/15/2023  No history of vitamin D deficiency: Lab Results  Component Value Date   VD25OH 57.56 09/05/2023   VD25OH 71.84 10/26/2022   VD25OH 71.78 09/01/2022   VD25OH 62.93 09/01/2021   VD25OH 76 08/31/2020   VD25OH 80.9 03/31/2020   VD25OH 100.64 (H) 03/30/2020   VD25OH 104.70 (HH) 01/29/2020  07/2019: vitamin D 89 She had  problems with her insurance paying for her vitamin D levels.  Pt is on: - vitamin D 2000 IU + MVI >> after last visit, decreased to 1000 units +  + - vitamin K2 + 400 IU vit D (total: ~2000 units)  She drinks almond milk, eats cheese, yogurt.  She does weightbearing exercises.  She is also walks/jogging - 2 mi a day, with her dog.  She continues OsteoStrong skeletal loading-started 02/2020 - once a week >> stopped before last visit 2/2 shoulder pain, but restarted since.   She does not take high vitamin A doses.  Menopause was at 68 y/o.  No history of HRT.  FH of osteoporosis: mother.  No hyper or hypocalcemia, hyperparathyroidism.  No history of kidney stones. Lab Results  Component Value Date   PTH 31 10/26/2022   CALCIUM 9.7 09/05/2023   CALCIUM 9.8 10/26/2022   CALCIUM 9.3 10/26/2022   CALCIUM 9.5 09/01/2022   CALCIUM 10.1 09/01/2021   CALCIUM 9.8 08/31/2020   CALCIUM 9.8 01/29/2020   CALCIUM 9.6 08/07/2019  07/2019: Ca 9.6  She has a history of hypothyroidism, previously on levothyroxine 50 mcg daily since age 26 >> then decreased to 25 mcg, and then off since 10/2020.  We restarted LT4 25 mcg daily this in 10/2022: - in am - fasting - at least 30 min from b'fast - no calcium - no iron - no multivitamins - no PPIs - not on Biotin - B6 + B12  She felt better after starting levothyroxine, with less body aches.  Latest  TFTs: Lab Results  Component Value Date   TSH 3.90 06/08/2023   TSH 2.27 03/17/2023   TSH 3.24 12/13/2022   TSH 6.29 (H) 10/26/2022   TSH 6.84 (H) 09/01/2022  07/2019: TSH 0.48  No CKD. Last BUN/Cr: Lab Results  Component Value Date   BUN 12 09/05/2023   CREATININE 0.93 09/05/2023   Diet: - Coffee with creamer - Breakfast: Oatmeal with almonds, flax/Chia seeds or coconut yogurt - Lunch: Skips - Midafternoon snack: Nuts, cheese, popcorn - Dinner: Patient/poultry with vegetables/greens, smoothie with almond milk  Supplements:  Immunity support, turmeric, prev. On biotin 10,000 mcg daily >> stopped, methyl B12 and folate, probiotic, vitamin K2, niacinamide, magnesium, vitamin C.  She is on Lexapro to improve sleep - for IC (wakes up at night frequently).  ROS: + see HPI  I reviewed pt's medications, allergies, PMH, social hx, family hx, and changes were documented in the history of present illness. Otherwise, unchanged from my initial visit note.  PMH: + See HPI, also: -Interstitial cystitis  PSxH: -Bladder injection-Botox 2007, 2012, 2018 -Cataract surgery 2013 -Vitrectomy 2012 -Laser-assisted in situ keratomileusis 2000 -Excision of cyst in the right breast-1985, left breast-1999 -Repair of inguinal hernia 1996  Social History   Socioeconomic History   Marital status: Married    Spouse name: Not on file   Number of children: 3   Years of education: Not on file   Highest education level: Not on file  Occupational History    Homemaker/community volunteer  Tobacco Use   Smoking status: Not on file  Substance and Sexual Activity   Alcohol use:  1 glass of wine once a week   Social Determinants of Health   Financial Resource Strain:    Difficulty of Paying Living Expenses: Not on file  Food Insecurity:    Worried About Programme researcher, broadcasting/film/video in the Last Year: Not on file   [employer] of Food in the Last Year: Not on file  Transportation Needs:    Lack of Transportation (Medical): Not on file   Lack of Transportation (Non-Medical): Not on file  Physical Activity:    Days of Exercise per Week: Not on file   Minutes of Exercise per Session: Not on file  Stress:    Feeling of Stress : Not on file  Social Connections:    Frequency of Communication with Friends and Family: Not on file   Frequency of Social Gatherings with Friends and Family: Not on file   Attends Religious Services: Not on file   Active Member of Clubs or Organizations: Not on file   Attends Banker Meetings: Not on file    Marital Status: Not on file  Intimate Partner Violence:    Fear of Current or Ex-Partner: Not on file   Emotionally Abused: Not on file   Physically Abused: Not on file   Sexually Abused: Not on file   Current Outpatient Medications  Medication Sig Dispense Refill   celecoxib (CELEBREX) 100 MG capsule Take 1 capsule (100 mg total) by mouth 2 (two) times daily. 60 capsule 0   Cholecalciferol (VITAMIN D3 PO) Take 2,000 Units by mouth daily.     denosumab (PROLIA) 60 MG/ML SOSY injection INJECT 60 MG UNDER THE SKIN ONCE FOR 1 DOSE 1 mL 0   GEMTESA 75 MG TABS Take 1 tablet by mouth daily.     levothyroxine (SYNTHROID) 25 MCG tablet Take 1 tablet by mouth once daily 90 tablet 0   MAGNESIUM PO  Take 200 mg by mouth daily.     OVER THE COUNTER MEDICATION Immunity support 1000mg  daily     OVER THE COUNTER MEDICATION Turmeric/curcumin 1000mg  daily     OVER THE COUNTER MEDICATION Methyl B-12 and Methyl Folate(plus P-5-P/B-6) once a day     Probiotic Product (PROBIOTIC DAILY PO) Take by mouth.     No current facility-administered medications for this visit.  + Supplements: See HPI  Allergies  Allergen Reactions   Sulfa Antibiotics Hives   FH: - Sister, M and F with HTN - MGM with uterine CA  PE: BP 112/60   Pulse 86   Ht 5' 5.5" (1.664 m)   Wt 114 lb 9.6 oz (52 kg)   SpO2 98%   BMI 18.78 kg/m  Wt Readings from Last 3 Encounters:  01/12/24 114 lb 9.6 oz (52 kg)  10/25/23 116 lb (52.6 kg)  09/05/23 114 lb 3.2 oz (51.8 kg)   Constitutional: thin, in NAD Eyes:  EOMI, no exophthalmos ENT: no neck masses, no cervical lymphadenopathy Cardiovascular: RRR, No MRG Respiratory: CTA B Musculoskeletal: no deformities Skin:no rashes Neurological: no tremor with outstretched hands  Assessment: 1. Osteoporosis  2. Elevated vitamin D level  3. Hypothyroidism  Plan: 1. Osteoporosis -Likely postmenopausal and she also has a family history of osteoporosis -No falls or  fractures since last visit -Reviewed previous bone density scans: The latest was from 05/2023 and this showed an improvement in the spine and left femoral neck T-scores and stability of the right femoral neck -She has been on Reclast for 2 years (2020-2021 and Actonel for 6 years (2013-2018) without side effects.  However, we started Prolia 07/2022, and she had 3 injections.  She tolerates them well, without jaw/thigh/hip pain. -Before last visit she had bursitis for which she got steroid injections.  She also gets these for left shoulder pain.  We did discuss about the negative effects of steroids on bone density and fracture risk and I advised her to try to get these only if absolutely necessary.  No steroid injections since last visit. -She is on an alkaline diet -She was doing OsteoStrong skeletal loading but was off at last visit due to shoulder pain.  She restarted since last OV.  She also continues to do weightbearing exercises, walking, running.  She is not smoking and no more than 1 alcoholic drink a day. -Her latest calcium level was normal: Lab Results  Component Value Date   CALCIUM 9.7 09/05/2023  -Previous PTH level was normal at 31 and vitamin D levels have also been normal lately. -Will continue Prolia for now. -She is not on Medicare and we discussed that this insurance covers bone density scans every 2 years.  She would prefer to have yearly scans, though, and since she is on [facility] still, we may be able to check another bone density scan this year.  I ordered the scan today. -I plan to see her back in 1 year  2.  Elevated vitamin D level -She had a high vitamin D level in 03/2020, at 104.  I advised her to decrease to daily vitamin D supplement at that time: -She continues on vitamin D and K2 - approximately 2400 units vitamin D daily -Vitamin D level normal: Lab Results  Component Value Date   VD25OH 57.56 09/05/2023  -we will recheck the level at next  visit  3. Hypothyroidism -She has a history of being on levothyroxine when she was 68  years old and was on a very low dose (25 mcg daily) until 10/2020.  We tried to start this but TSH increased slightly above target range and she wanted to retry a low-dose levothyroxine.  She is back on 25 mcg daily. - latest thyroid labs reviewed with pt. >> normal: Lab Results  Component Value Date   TSH 3.90 06/08/2023  - she continues on LT4 25 mcg daily - pt feels good on this dose. - we discussed about taking the thyroid hormone every day, with water, >30 minutes before breakfast, separated by >4 hours from acid reflux medications, calcium, iron, multivitamins. Pt. is taking it correctly. - will check thyroid tests today: TSH and fT4 - If labs are abnormal, she will need to return for repeat TFTs in 1.5 months  Needs LT4 refills.  Orders Placed This Encounter  Procedures   DG Bone Density   TSH   T4, free   Component     Latest Ref Rng 01/12/2024  TSH     0.40 - 4.50 mIU/L 3.75   T4,Free(Direct)     0.8 - 1.8 ng/dL 1.3   TFTs are normal.  Carlus Pavlov, MD PhD [facility]

## 2024-01-12 NOTE — Addendum Note (Signed)
Addended by: Carlus Pavlov on: 01/12/2024 04:35 PM   Modules accepted: Orders

## 2024-01-14 NOTE — Telephone Encounter (Signed)
 Prolia VOB initiated via [email or web address]  Next Prolia inj DUE: 02/13/24

## 2024-01-15 NOTE — Progress Notes (Signed)
 [facility] [address] [place] [address] Phone: [phone] Subjective:   Sophia [name] am a scribe for Dr. Katrinka Blazing.   I'm seeing this patient by the request  of:  Karie Georges, MD  CC: Left shoulder pain follow-up  [id number]  10/25/2023 Patient has been doing relatively well overall.  We discussed with patient about which activities to do and which ones to avoid.  Increase activity slowly otherwise.  Follow-up with me again in 2 to 3 months.  Patient has responded previously to some anti-inflammatories and given some Celebrex.  Patient does have a history of sulfa allergy will watch for any type of rash and discontinue immediately.  Patient wanted to try this to see if it could potentially make improvement.      Update 01/23/2024 Sophia [name] is a 68 y.o. female coming in with complaint of L AC joint pain.  Has had difficulty of a effusion of the left shoulder as well over the course of time.  Was doing better at last follow-up and given the Celebrex 100 mg twice a day.  Patient states feeling good. No flare ups this week.      Past Medical History:  Diagnosis Date   Hypothyroidism    Past Surgical History:  Procedure Laterality Date   BLADDER INSTILLATION     2007,2012,2018 botox   BREAST CYST EXCISION Bilateral 1985, 1995   both benign, 1 on each breast   BREAST SURGERY     1985-right, mid 1990s-left benign (fibrocystic breast disease)   CATARACT EXTRACTION Left 2013   EYE SURGERY Left    done for floaters   INGUINAL HERNIA REPAIR Left 1986   LASIK Bilateral    2000   Social History   Socioeconomic History   Marital status: Married    Spouse name: Not on file   Number of children: Not on file   Years of education: Not on file   Highest education level: Not on file  Occupational History   Not on file  Tobacco Use   Smoking status: Never   Smokeless tobacco: Never  Vaping Use   Vaping status: Never  Used  Substance and Sexual Activity   Alcohol use: Yes    Comment: occasionally   Drug use: Not on file   Sexual activity: Not on file  Other Topics Concern   Not on file  Social History Narrative   Not on file   Social Drivers of Health   Financial Resource Strain: Not on file  Food Insecurity: Not on file  Transportation Needs: Not on file  Physical Activity: Not on file  Stress: Not on file  Social Connections: Not on file   Allergies  Allergen Reactions   Sulfa Antibiotics Hives   Family History  Problem Relation Age of Onset   Arthritis Mother    Hypertension Mother    Hypertension Father    Prostate cancer Father 85   Basal cell carcinoma Father    Hypertension Sister    Colon polyps Sister    Diabetes Mellitus II Maternal Grandmother    Uterine cancer Maternal Grandmother        ? not certain   Colon polyps Sister    Colon cancer Neg Hx    Esophageal cancer Neg Hx    Pancreatic cancer Neg Hx     Current Outpatient Medications (Endocrine & Metabolic):    denosumab (PROLIA) 60 MG/ML SOSY injection, INJECT 60 MG UNDER THE  SKIN ONCE FOR 1 DOSE   levothyroxine (SYNTHROID) 25 MCG tablet, Take 1 tablet (25 mcg total) by mouth daily.    Current Outpatient Medications (Analgesics):    celecoxib (CELEBREX) 100 MG capsule, Take 1 capsule (100 mg total) by mouth 2 (two) times daily.   meloxicam (MOBIC) 7.5 MG tablet, Take 1 tablet (7.5 mg total) by mouth daily.   Current Outpatient Medications (Other):    Cholecalciferol (VITAMIN D3 PO), Take 2,000 Units by mouth daily.   GEMTESA 75 MG TABS, Take 1 tablet by mouth daily.   MAGNESIUM PO, Take 200 mg by mouth daily.   OVER THE COUNTER MEDICATION, Immunity support 1000mg  daily   OVER THE COUNTER MEDICATION, Turmeric/curcumin 1000mg  daily   OVER THE COUNTER MEDICATION, Methyl B-12 and Methyl Folate(plus P-5-P/B-6) once a day   Probiotic Product (PROBIOTIC DAILY PO), Take by mouth.   Reviewed prior  external information including notes and imaging from  primary care provider As well as notes that were available from care everywhere and other healthcare systems.  Past medical history, social, surgical and family history all reviewed in electronic medical record.  No pertanent information unless stated regarding to the chief complaint.   Review of Systems:  No headache, visual changes, nausea, vomiting, diarrhea, constipation, dizziness, abdominal pain, skin rash, fevers, chills, night sweats, weight loss, swollen lymph nodes,  joint swelling, chest pain, shortness of breath, mood changes. POSITIVE muscle aches, body aches   Objective  Blood pressure (!) 102/50, pulse 74, height 5' 5.5" (1.664 m), weight 115 lb (52.2 kg), SpO2 97%.   General: No apparent distress alert and oriented x3 mood and affect normal, dressed appropriately.  HEENT: Pupils equal, extraocular movements intact  Respiratory: Patient's speak in full sentences and does not appear short of breath  Cardiovascular: No lower extremity edema, non tender, no erythema  Left shoulder limited ROM,  Patient does have some limited cross body range of motion as well.  No significant atrophy noted.    Impression and Recommendations:    The above documentation has been reviewed and is accurate and complete Sophia Saa, DO

## 2024-01-20 NOTE — Telephone Encounter (Signed)
 Medical buy and Bill - Prior Authorization REQUIRED for [employer]

## 2024-01-23 ENCOUNTER — Encounter: Payer: Self-pay | Admitting: Family Medicine

## 2024-01-23 ENCOUNTER — Ambulatory Visit: Payer: Medicare Other | Admitting: Family Medicine

## 2024-01-23 VITALS — BP 102/50 | HR 74 | Ht 65.5 in | Wt 115.0 lb

## 2024-01-23 DIAGNOSIS — M25512 Pain in left shoulder: Secondary | ICD-10-CM | POA: Diagnosis not present

## 2024-01-23 DIAGNOSIS — M25412 Effusion, left shoulder: Secondary | ICD-10-CM | POA: Diagnosis not present

## 2024-01-23 MED ORDER — MELOXICAM 7.5 MG PO TABS: 7.5000 mg | ORAL_TABLET | Freq: Every day | ORAL | 1 refills | Status: AC

## 2024-01-23 NOTE — Patient Instructions (Addendum)
 Good to see you. Start Meloxicam 7.5 mg tablet Return in 3 months.

## 2024-01-23 NOTE — Assessment & Plan Note (Signed)
 Discussed HEP  Discussed which activities to do and which ones to avoid.  Patient has done well but is having some pain with the Celebrex and will switch to the 7.5 mg of the meloxicam to see if this will help it and decrease the amount of stomach pain.  Laboratory workup showed patient's kidney function is doing well.  Discussed if any side effects we can change other medications as well.  Discussed which activities to do and which ones to avoid.  Follow-up with me again 3 months

## 2024-01-23 NOTE — Assessment & Plan Note (Signed)
 Doing well overall.  Discussed icing regimen and home exercises, discussed with patient about which activities to do and which ones to avoid.  Increase activity slowly.  Discussed which activities will be beneficial in which ones would be worse.  Increase activity slowly.  Follow-up with me again in 6 to 8 weeks

## 2024-01-24 NOTE — Telephone Encounter (Signed)
 Would like to double check VOB, re-submitted.

## 2024-01-24 NOTE — Telephone Encounter (Signed)
 [employer] [place] Medicare Supplement  Medical Buy and Bill  Prior Authorization for [employer] APPROVED PA# [id number] Valid: 08/10/23-08/09/24

## 2024-01-30 NOTE — Telephone Encounter (Signed)
 Pt ready for scheduling on or after 02/13/24  Out-of-pocket cost due at time of visit: $0  Primary: [employer] [place] [employer] Prolia co-insurance: 0% Admin fee co-insurance: 0%  Deductible: does not apply  Prior Auth: APPROVED PA# [id number] Valid: 08/10/23-08/09/24  Secondary: N/A Prolia co-insurance:  Admin fee co-insurance:  Deductible:  Prior Auth:  PA# Valid:     ** This summary of benefits is an estimation of the patient's out-of-pocket cost. Exact cost may very based on individual plan coverage.

## 2024-02-09 ENCOUNTER — Encounter: Payer: Self-pay | Admitting: Pediatrics

## 2024-02-12 DIAGNOSIS — Z124 Encounter for screening for malignant neoplasm of cervix: Secondary | ICD-10-CM | POA: Diagnosis not present

## 2024-02-12 DIAGNOSIS — Z01419 Encounter for gynecological examination (general) (routine) without abnormal findings: Secondary | ICD-10-CM | POA: Diagnosis not present

## 2024-02-12 DIAGNOSIS — M21611 Bunion of right foot: Secondary | ICD-10-CM | POA: Diagnosis not present

## 2024-02-12 DIAGNOSIS — M2022 Hallux rigidus, left foot: Secondary | ICD-10-CM | POA: Diagnosis not present

## 2024-02-13 ENCOUNTER — Ambulatory Visit: Payer: BC Managed Care – PPO

## 2024-02-16 ENCOUNTER — Ambulatory Visit: Payer: BC Managed Care – PPO

## 2024-02-16 DIAGNOSIS — M810 Age-related osteoporosis without current pathological fracture: Secondary | ICD-10-CM | POA: Diagnosis not present

## 2024-02-16 MED ORDER — DENOSUMAB 60 MG/ML ~~LOC~~ SOSY
60.0000 mg | PREFILLED_SYRINGE | Freq: Once | SUBCUTANEOUS | Status: AC
Start: 2024-08-18 — End: ?

## 2024-02-16 MED ORDER — DENOSUMAB 60 MG/ML ~~LOC~~ SOSY
60.0000 mg | PREFILLED_SYRINGE | Freq: Once | SUBCUTANEOUS | Status: AC
Start: 2024-02-16 — End: 2024-02-16
  Administered 2024-02-16: 60 mg via SUBCUTANEOUS

## 2024-02-16 NOTE — Progress Notes (Signed)
After obtaining consent, and per orders of Dr. Elvera Lennox, injection of Prolia given by Pollie Meyer. Patient instructed to remain in clinic for 20 minutes afterwards, and to report any adverse reaction to me immediately.

## 2024-02-20 NOTE — Telephone Encounter (Signed)
 Last Prolia inj 02/16/24 Next Prolia inj due 08/19/24

## 2024-03-13 ENCOUNTER — Ambulatory Visit

## 2024-03-13 ENCOUNTER — Ambulatory Visit: Payer: Medicare Other

## 2024-03-14 DIAGNOSIS — L649 Androgenic alopecia, unspecified: Secondary | ICD-10-CM | POA: Diagnosis not present

## 2024-03-14 DIAGNOSIS — D225 Melanocytic nevi of trunk: Secondary | ICD-10-CM | POA: Diagnosis not present

## 2024-03-14 DIAGNOSIS — L650 Telogen effluvium: Secondary | ICD-10-CM | POA: Diagnosis not present

## 2024-03-14 DIAGNOSIS — L821 Other seborrheic keratosis: Secondary | ICD-10-CM | POA: Diagnosis not present

## 2024-03-15 ENCOUNTER — Ambulatory Visit
Admission: RE | Admit: 2024-03-15 | Discharge: 2024-03-15 | Disposition: A | Discharge status: Home / Self Care | Source: Ambulatory Visit | Attending: Obstetrics and Gynecology | Admitting: Obstetrics and Gynecology

## 2024-03-15 DIAGNOSIS — Z1231 Encounter for screening mammogram for malignant neoplasm of breast: Secondary | ICD-10-CM | POA: Diagnosis not present

## 2024-03-28 ENCOUNTER — Encounter: Payer: Self-pay | Admitting: Family Medicine

## 2024-03-28 NOTE — Telephone Encounter (Signed)
 Ok for patient to establish with me-- please let Sophia [name] know I said it was ok. If it is something that requires a new prescription I would need to see her in the office for a visit. It doesn't look like we ever talked about insomnia before at her previous visits.

## 2024-04-16 NOTE — Progress Notes (Unsigned)
 [facility] Initial Consultation   Referring Provider Aida House, MD [address] [place],  [place] [address]  Primary Care Provider Aida House, MD  Patient Profile: Sophia [name] is a 68 y.o. female who is seen in consultation in the [facility] at the request of Dr. Bambi Lever for evaluation and management of the problem(s) noted below.  Problem List: History of colon polyps-nonadvanced tubular adenoma on colonoscopy 2019 Family history of colon polyps (2 sisters)   History of Present Illness   Sophia [name] is a 68 y.o. female with a history of hypothyroidism and interstitial cystitis who is referred to the gastroenterology office to discuss colorectal polyp surveillance   Last colonoscopy: 04/2021 - 2 hyperplastic polyps Last endoscopy: None  Last Abd CT/CTE/MRE: None  GI Review of Symptoms Significant for {GIROS:50592}. Otherwise negative.  General Review of Systems  Review of systems is significant for the pertinent positives and negatives as listed per the HPI.  Full ROS is otherwise negative.  Past Medical History   Past Medical History:  Diagnosis Date   Hypothyroidism      Past Surgical History   Past Surgical History:  Procedure Laterality Date   BLADDER INSTILLATION     2007,2012,2018 botox   BREAST CYST EXCISION Bilateral 1985, 1995   both benign, 1 on each breast   BREAST SURGERY     1985-right, mid 1990s-left benign (fibrocystic breast disease)   CATARACT EXTRACTION Left 2013   EYE SURGERY Left    done for floaters   INGUINAL HERNIA REPAIR Left 1986   LASIK Bilateral    2000     Allergies and Medications   Allergies  Allergen Reactions   Sulfa Antibiotics Hives    @MEDSTODAY @  Family History   Family History  Problem Relation Age of Onset   Arthritis Mother    Hypertension Mother    Hypertension Father    Prostate cancer Father 6   Basal cell carcinoma Father    Hypertension  Sister    Colon polyps Sister    Diabetes Mellitus II Maternal Grandmother    Uterine cancer Maternal Grandmother        ? not certain   Colon polyps Sister    Colon cancer Neg Hx    Esophageal cancer Neg Hx    Pancreatic cancer Neg Hx      Social History   Social History   Tobacco Use   Smoking status: Never   Smokeless tobacco: Never  Vaping Use   Vaping status: Never Used  Substance Use Topics   Alcohol use: Yes    Comment: occasionally   Priscila reports that she has never smoked. She has never used smokeless tobacco. She reports current alcohol use. No history on file for drug use.  Vital Signs and Physical Examination   There were no vitals filed for this visit. There is no height or weight on file to calculate BMI.    General: Well developed, well nourished, no acute distress Head: Normocephalic and atraumatic Eyes: Sclerae anicteric, EOMI Ears: Normal auditory acuity Mouth: No deformities or lesions noted Lungs: Clear throughout to auscultation Heart: Regular rate and rhythm; No murmurs, rubs or bruits Abdomen: Soft, non tender and non distended. No masses, hepatosplenomegaly or hernias noted. Normal Bowel sounds Rectal: Musculoskeletal: Symmetrical with no gross deformities  Pulses:  Normal pulses noted Extremities: No edema or deformities noted Neurological: Alert oriented x 4, grossly nonfocal Psychological:  Alert and cooperative. Normal mood and affect  Review of Data  The following data was reviewed at the time of this encounter:  Laboratory Studies      Latest Ref Rng & Units 09/05/2023    8:55 AM 10/26/2022    9:36 AM 09/01/2021   10:17 AM  CBC  WBC 4.0 - 10.5 K/uL 6.4  7.9  9.8   Hemoglobin 12.0 - 15.0 g/dL 16.1  09.6  04.5   Hematocrit 36.0 - 46.0 % 42.3  40.2  41.0   Platelets 150.0 - 400.0 K/uL 277.0  261.0  260.0     No results found for: "LIPASE"    Latest Ref Rng & Units 09/05/2023    8:55 AM 10/26/2022    9:36 AM 09/01/2022     8:38 AM  CMP  Glucose 70 - 99 mg/dL 84  83  86   BUN 6 - 23 mg/dL 12  16  13    Creatinine 0.40 - 1.20 mg/dL 4.09  8.11  9.14   Sodium 135 - 145 mEq/L 140  139  140   Potassium 3.5 - 5.1 mEq/L 4.8  3.8  4.3   Chloride 96 - 112 mEq/L 104  102  104   CO2 19 - 32 mEq/L 29  30  28    Calcium 8.4 - 10.5 mg/dL 9.7  9.8    9.3  9.5   Total Protein 6.0 - 8.3 g/dL 7.1  7.2  7.1   Total Bilirubin 0.2 - 1.2 mg/dL 0.4  0.4  0.5   Alkaline Phos 39 - 117 U/L 31  42  44   AST 0 - 37 U/L 22  23  21    ALT 0 - 35 U/L 17  18  17       Imaging Studies    GI Procedures and Studies   Colonoscopy 04/2021 2 hyperplastic polyps  Colonoscopy 11/24/2017 2 small polyps and IH Path: TA, HP   Clinical Impression  It is my clinical impression that Sophia [name] is a 68 y.o. female with;  ***  Plan  *** *** *** *** ***  Planned Follow Up No follow-ups on file.  The patient or caregiver verbalized understanding of the material covered, with no barriers to understanding. All questions were answered. Patient or caregiver is agreeable with the plan outlined above.    It was a pleasure to see [place].  If you have any questions or concerns regarding this evaluation, do not hesitate to contact me.  Eugenia Hess, MD [facility]

## 2024-04-17 ENCOUNTER — Encounter: Payer: Self-pay | Admitting: Pediatrics

## 2024-04-17 ENCOUNTER — Ambulatory Visit: Admitting: Pediatrics

## 2024-04-17 VITALS — BP 116/62 | HR 62 | Ht 65.0 in | Wt 116.0 lb

## 2024-04-17 DIAGNOSIS — Z860101 Personal history of adenomatous and serrated colon polyps: Secondary | ICD-10-CM

## 2024-04-17 DIAGNOSIS — Z83719 Family history of colon polyps, unspecified: Secondary | ICD-10-CM | POA: Diagnosis not present

## 2024-04-17 DIAGNOSIS — Z860100 Personal history of colon polyps, unspecified: Secondary | ICD-10-CM

## 2024-04-17 DIAGNOSIS — K5900 Constipation, unspecified: Secondary | ICD-10-CM | POA: Diagnosis not present

## 2024-04-17 DIAGNOSIS — Z860102 Personal history of hyperplastic colon polyps: Secondary | ICD-10-CM | POA: Diagnosis not present

## 2024-04-17 NOTE — Patient Instructions (Signed)
 Follow up as needed.  Thank you for entrusting me with your care and for choosing [facility],  Dr. Eugenia Hess  _______________________________________________________  If your blood pressure at your visit was 140/90 or greater, please contact your primary care physician to follow up on this.  _______________________________________________________  If you are age 66 or older, your body mass index should be between 23-30. Your Body mass index is 19.3 kg/m. If this is out of the aforementioned range listed, please consider follow up with your Primary Care Provider.  If you are age 45 or younger, your body mass index should be between 19-25. Your Body mass index is 19.3 kg/m. If this is out of the aformentioned range listed, please consider follow up with your Primary Care Provider.   ________________________________________________________  The [facility] providers would like to encourage you to use MYCHART to communicate with providers for non-urgent requests or questions.  Due to long hold times on the telephone, sending your provider a message by [facility] may be a faster and more efficient way to get a response.  Please allow 48 business hours for a response.  Please remember that this is for non-urgent requests.  _______________________________________________________

## 2024-04-23 NOTE — Progress Notes (Signed)
 [facility] [address] [place] [address] Phone: [phone] Subjective:   Sophia [name], am serving as a scribe for Dr. Ronnell Coins.  I'm seeing this patient by the request  of:  Aida House, MD  CC: left ac joint pain follow up   [id number]  01/23/2024 Discussed HEP  Discussed which activities to do and which ones to avoid.  Patient has done well but is having some pain with the Celebrex  and will switch to the 7.5 mg of the meloxicam  to see if this will help it and decrease the amount of stomach pain.  Laboratory workup showed patient's kidney function is doing well.  Discussed if any side effects we can change other medications as well.  Discussed which activities to do and which ones to avoid.  Follow-up with me again 3 months      Update 04/30/2024 Sophia [name] is a 68 y.o. female coming in with complaint of L AC joint pain. Patient states that her shoulder is doing much better. Using meloxicam  prn.   Has some questions about her bunions.   Continuing Prolia  2x a year and does Osteostrong. Otherwise walks and jogs outside as well as strength training.       Past Medical History:  Diagnosis Date   Chronic constipation    Colon polyp    Hypothyroidism    Interstitial cystitis    Past Surgical History:  Procedure Laterality Date   BLADDER INSTILLATION     2007,2012,2018 botox   BREAST CYST EXCISION Bilateral 1985, 1995   both benign, 1 on each breast   BREAST SURGERY     1985-right, mid 1990s-left benign (fibrocystic breast disease)   CATARACT EXTRACTION Left 2013   EYE SURGERY Left    done for floaters   INGUINAL HERNIA REPAIR Left 1986   LASIK Bilateral    2000   Social History   Socioeconomic History   Marital status: Married    Spouse name: Not on file   Number of children: Not on file   Years of education: Not on file   Highest education level: Not on file  Occupational History   Not on file   Tobacco Use   Smoking status: Never   Smokeless tobacco: Never  Vaping Use   Vaping status: Never Used  Substance and Sexual Activity   Alcohol use: Yes    Comment: occasionally   Drug use: Not on file   Sexual activity: Not on file  Other Topics Concern   Not on file  Social History Narrative   Not on file   Social Drivers of Health   Financial Resource Strain: Not on file  Food Insecurity: Not on file  Transportation Needs: Not on file  Physical Activity: Not on file  Stress: Not on file  Social Connections: Not on file   Allergies  Allergen Reactions   Sulfa Antibiotics Hives   Family History  Problem Relation Age of Onset   Arthritis Mother    Hypertension Mother    Hypertension Father    Prostate cancer Father 52   Basal cell carcinoma Father    Hypertension Sister    Colon polyps Sister    Diabetes Mellitus II Maternal Grandmother    Uterine cancer Maternal Grandmother        ? not certain   Colon polyps Sister    Colon cancer Neg Hx    Esophageal cancer Neg Hx    Pancreatic cancer  Neg Hx     Current Outpatient Medications (Endocrine & Metabolic):    denosumab  (PROLIA ) 60 MG/ML SOSY injection, INJECT 60 MG UNDER THE SKIN ONCE FOR 1 DOSE   levothyroxine  (SYNTHROID ) 25 MCG tablet, Take 1 tablet (25 mcg total) by mouth daily.  Current Facility-Administered Medications (Endocrine & Metabolic):    [START ON 08/18/2024] denosumab  (PROLIA ) injection 60 mg      Current Outpatient Medications (Analgesics):    celecoxib  (CELEBREX ) 100 MG capsule, Take 1 capsule (100 mg total) by mouth 2 (two) times daily.   meloxicam  (MOBIC ) 7.5 MG tablet, Take 1 tablet (7.5 mg total) by mouth daily.     Current Outpatient Medications (Other):    Cholecalciferol (VITAMIN D3 PO), Take 2,000 Units by mouth daily.   GEMTESA 75 MG TABS, Take 1 tablet by mouth daily.   MAGNESIUM PO, Take 200 mg by mouth daily.   OVER THE COUNTER MEDICATION, Immunity support 1000mg  daily    OVER THE COUNTER MEDICATION, Turmeric/curcumin 1000mg  daily   OVER THE COUNTER MEDICATION, Methyl B-12 1000mcg and Methyl Folate(plus P-5-P/B-6) 400mcg once a day   Probiotic Product (PROBIOTIC DAILY PO), Take by mouth.    Objective  Blood pressure 98/62, pulse 62, height 5\' 5"  (1.651 m), weight 115 lb (52.2 kg), SpO2 98%.   General: No apparent distress alert and oriented x3 mood and affect normal, dressed appropriately.  HEENT: Pupils equal, extraocular movements intact  Respiratory: Patient's speak in full sentences and does not appear short of breath  Cardiovascular: No lower extremity edema, non tender, no erythema  Left shoulder exam shows patient does have some tightness noted.  Patient does have positive crossover noted as well.  Patient though has full strength noted. Foot exam does have breakdown of the transverse arch noted.  Patient does have bunion and bunionette formation noted.  Hammering of the 2nd and 3rd toes worse on the right than the left.  Nontender on exam today.     Impression and Recommendations:     The above documentation has been reviewed and is accurate and complete Aydin Cavalieri M Raylynn Hersh, DO

## 2024-04-30 ENCOUNTER — Ambulatory Visit: Admitting: Family Medicine

## 2024-04-30 ENCOUNTER — Other Ambulatory Visit: Payer: Self-pay

## 2024-04-30 VITALS — BP 98/62 | HR 62 | Ht 65.0 in | Wt 115.0 lb

## 2024-04-30 DIAGNOSIS — M25512 Pain in left shoulder: Secondary | ICD-10-CM | POA: Diagnosis not present

## 2024-04-30 DIAGNOSIS — M216X9 Other acquired deformities of unspecified foot: Secondary | ICD-10-CM | POA: Diagnosis not present

## 2024-04-30 NOTE — Assessment & Plan Note (Signed)
 Significant improvement at this time.  Discussed icing regimen.  Will continue to monitor but is no pain whatsoever at this time.

## 2024-04-30 NOTE — Assessment & Plan Note (Signed)
 Significant breakdown of the transverse arch noted.  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  Since referral to have these custom orthotics made and I do think will respond well to the conservative therapy.

## 2024-04-30 NOTE — Patient Instructions (Signed)
 Custom orthotics Shoulder is great See me in 4 months

## 2024-06-16 ENCOUNTER — Encounter: Payer: Self-pay | Admitting: Internal Medicine

## 2024-07-06 NOTE — Telephone Encounter (Signed)
 Prolia VOB initiated via [email or web address]  Next Prolia inj DUE: 08/19/24

## 2024-07-29 ENCOUNTER — Other Ambulatory Visit: Payer: Self-pay | Admitting: Obstetrics and Gynecology

## 2024-07-29 DIAGNOSIS — R92343 Mammographic extreme density, bilateral breasts: Secondary | ICD-10-CM

## 2024-08-02 ENCOUNTER — Encounter: Payer: Self-pay | Admitting: Pediatrics

## 2024-08-05 NOTE — Telephone Encounter (Signed)
 Medical Buy and Bill  Prior Authorization REQUIRED for PROLIA   PA PROCESS DETAILS: PA is required and is on file. Authorization Number: [id number] Approval Date:  2023-08-10 Approved Number of Injections: 2 Treatments Expires on: 2024-08-09 The PA department can  be contacted at [phone].

## 2024-08-08 NOTE — Telephone Encounter (Signed)
 Medical Buy and Bill  Prior Authorization initiated for PROLIA  via [employer]/ [employer] portal RX Case#: [id number]

## 2024-08-15 NOTE — Telephone Encounter (Signed)
 Medical Buy and [employer]ell  Prior Authorization for PROLIA  APPROVED  PA# [id number] Valid: 08/08/24-08/08/25

## 2024-08-18 NOTE — Telephone Encounter (Signed)
 Due to recent insurance policy changes, VOB resubmitted

## 2024-08-20 ENCOUNTER — Ambulatory Visit

## 2024-08-21 NOTE — Telephone Encounter (Signed)
 Last Prolia  inj 02/16/24 Next Prolia  inj due 08/19/24  Sorry about that!

## 2024-08-21 NOTE — Telephone Encounter (Addendum)
 Medical Buy and Zell  Patient is ready for scheduling on or after 08/19/24  Out-of-pocket cost due at time of visit: $981.28 (remaining deductible); $45 after deductible has been satisfied  Primary: [employer] [place] Prolia  co-insurance: $45 Admin fee co-insurance: 0%  Deductible: $18.71 of $1,000  Prior Auth: APPROVED PA# [id number] Valid: 08/08/24-08/08/25  Secondary: N/A Prolia  co-insurance:  Admin fee co-insurance:  Deductible:  Prior Auth:  PA# Valid:   ** This summary of benefits is an estimation of the patient's out-of-pocket cost. Exact cost may vary based on individual plan coverage.    COVERAGE DETAILS: For the primary MD Purchase option, Prolia  is subject to a $45 copay.  Administration is covered at 100%. Copay does contribute to a $3000 out of pocket max ($282.65 met).  The plan also has a $1000 deductible ($18.71 met), which does contribute to the out of pocket max. No  coinsurance applies. We have provided in network benefits only.   PA PROCESS DETAILS: PA is required and is on file. Authorization Number: [id number] Approval Date:  2024-08-08 Approved Number of Injections: 2 Treatments Expires on: 2025-08-08 The PA department can  be contacted at [phone].

## 2024-08-22 ENCOUNTER — Telehealth: Payer: Self-pay | Admitting: Internal Medicine

## 2024-08-22 ENCOUNTER — Encounter: Payer: Self-pay | Admitting: Family Medicine

## 2024-08-22 ENCOUNTER — Encounter: Payer: Self-pay | Admitting: Internal Medicine

## 2024-08-22 NOTE — Telephone Encounter (Signed)
 Patient is calling concerning her appointment for her Prolia  injection for tomorrow, Friday 08/23/2024.  Patient states that she has talked with her insurance company [employer]) and they told her that they are waiting on office notes to proceed with the authorization.  The most recent authorization from Leotis Batter (dated 08/21/2024) states that it is authorized and her out of pocket is $981.28 (remaining deductible).  Patient wants verification again that that is what she will pay tomorrow because BCBS Medicare is telling her a different amount will be due.

## 2024-08-23 ENCOUNTER — Ambulatory Visit

## 2024-08-23 NOTE — Telephone Encounter (Signed)
 Approval letter (from 08/08/24) scanned to pt chart  OK to collect the amount due per [facility] / patient.

## 2024-08-23 NOTE — Telephone Encounter (Signed)
 Patient advised and will contact insurance and she was under the impression payment should be less.

## 2024-08-26 NOTE — Telephone Encounter (Signed)
  Re: Coverage Determination Discrepancy for Prolia   Patient: Sophia [name]  DOB: Nov 04, 1956  Approval ID: [id number] Service: Injection Denosumab  1 mg (Prolia )  Place of Service: Office (POS 11)  Dear Clinical Review Team,  We are writing to request clarification and resolution regarding conflicting coverage decisions for our patient, Sophia [name], concerning her ongoing Prolia  therapy under Medicare Part B (Medical Buy and Bill).  On August 08, 2024, our office received an approval letter for Prolia  therapy (Approval ID: [id number]), valid through August 08, 2025, indicating that medical criteria were met and 120 units were authorized for administration in the office setting.  However, on August 23, 2024, we received a denial letter citing lack of medical documentation. This denial appears to contradict the previously issued approval and may have been triggered in error, as this is a continuation of therapy for a patient who has been on Prolia  since 2023, with documented tolerance and positive clinical response.  To support this appeal, we have attached the following documentation: Original approval letter dated 08/08/2024 Denial letter dated 08/23/2024 Recent visit notes DEXA scan reports  Given the continuity of care and the existing approval, we respectfully request that the denial be overturned and coverage reinstated in accordance with the original authorization.  Thank you for your attention to this matter. Please do not hesitate to contact our office should you require any additional documentation or clarification.  Sincerely, Lela Fendt, MD

## 2024-08-26 NOTE — Telephone Encounter (Signed)
  Re: Coverage Determination Discrepancy for Prolia   Patient: Sophia [name]  DOB: Nov 04, 1956  Approval ID: [id number] Service: Injection Denosumab  1 mg (Prolia )  Place of Service: Office (POS 11)  Dear Clinical Review Team,  We are writing to request clarification and resolution regarding conflicting coverage decisions for our patient, Sophia [name], concerning her ongoing Prolia  therapy under Medicare Part B (Medical Buy and Bill).  On August 08, 2024, our office received an approval letter for Prolia  therapy (Approval ID: [id number]), valid through August 08, 2025, indicating that medical criteria were met and 120 units were authorized for administration in the office setting.  However, on August 23, 2024, we received a denial letter citing lack of medical documentation. This denial appears to contradict the previously issued approval and may have been triggered in error, as this is a continuation of therapy for a patient who has been on Prolia  since 2023, with documented tolerance and positive clinical response.  To support this appeal, we have attached the following documentation: Original approval letter dated 08/08/2024 Denial letter dated 08/23/2024 Recent visit notes DEXA scan reports  Given the continuity of care and the existing approval, we respectfully request that the denial be overturned and coverage reinstated in accordance with the original authorization.  Thank you for your attention to this matter. Please do not hesitate to contact our office should you require any additional documentation or clarification.  Sincerely, Sophia Fendt, MD

## 2024-08-26 NOTE — Telephone Encounter (Signed)
 Hey, I need to call [employer]BS because I see a APPROVAL letter from 08/08/24 but then a denial from 08/23/24, I need to find out what is going on.

## 2024-08-28 NOTE — Telephone Encounter (Signed)
 Keep scheduled PROLIA  injection - collect $99 quoted by BCBS from pt at check in.

## 2024-08-28 NOTE — Telephone Encounter (Signed)
 Medical Buy and Zell  Prior Authorization re-submitted for PROLIA  PA# [id number]  Pending review

## 2024-08-28 NOTE — Telephone Encounter (Signed)
 Called [employer] and obtained benefits for Prolia .   After 40 minutes on the phone checking the reason for the approval and denial letters, the call disconnected.   Pt will have 20% coinsurance Deductible does not apply No copay unless billed with OV

## 2024-08-28 NOTE — Telephone Encounter (Signed)
 Called and spoke with patient to discuss deductible and co-insurance.   Per [employer] - deductible has been met / per rep deductible does not apply

## 2024-08-30 ENCOUNTER — Ambulatory Visit

## 2024-08-30 VITALS — BP 104/62 | HR 79 | Resp 20 | Ht 65.0 in | Wt 116.0 lb

## 2024-08-30 DIAGNOSIS — M810 Age-related osteoporosis without current pathological fracture: Secondary | ICD-10-CM

## 2024-08-30 MED ORDER — DENOSUMAB 60 MG/ML ~~LOC~~ SOSY
60.0000 mg | PREFILLED_SYRINGE | Freq: Once | SUBCUTANEOUS | Status: AC
Start: 2024-08-30 — End: 2024-08-30
  Administered 2024-08-30: 60 mg via SUBCUTANEOUS

## 2024-08-30 MED ORDER — DENOSUMAB 60 MG/ML ~~LOC~~ SOSY
60.0000 mg | PREFILLED_SYRINGE | Freq: Once | SUBCUTANEOUS | Status: AC
Start: 2025-02-28 — End: ?

## 2024-08-30 NOTE — Progress Notes (Signed)
 After obtaining consent, and per orders of Dr. Elvera Lennox, injection of Prolia given by Tera Partridge. Patient instructed to remain in clinic for 20 minutes afterwards, and to report any adverse reaction to me immediately.

## 2024-09-02 ENCOUNTER — Other Ambulatory Visit: Payer: Self-pay

## 2024-09-02 ENCOUNTER — Ambulatory Visit

## 2024-09-02 ENCOUNTER — Telehealth: Payer: Self-pay

## 2024-09-02 VITALS — Ht 65.0 in | Wt 115.0 lb

## 2024-09-02 DIAGNOSIS — Z83719 Family history of colon polyps, unspecified: Secondary | ICD-10-CM

## 2024-09-02 DIAGNOSIS — Z860100 Personal history of colon polyps, unspecified: Secondary | ICD-10-CM

## 2024-09-02 DIAGNOSIS — K5900 Constipation, unspecified: Secondary | ICD-10-CM

## 2024-09-02 MED ORDER — SUTAB 1479-225-188 MG PO TABS: 12.0000 | ORAL_TABLET | ORAL | 0 refills | Status: DC

## 2024-09-02 MED ORDER — SCOPOLAMINE 1 MG/3DAYS TD PT72: 2.0000 | MEDICATED_PATCH | TRANSDERMAL | Status: AC

## 2024-09-02 NOTE — Telephone Encounter (Signed)
 Hello Dr. Suzann,   I completed this patiens PV apt. She is requesting a scope patch prior to her procedure. Anti nausea meds the day of do not tend to help her. Is this something that can be sent in for the patient.   Thank you

## 2024-09-02 NOTE — Telephone Encounter (Addendum)
 Medical Buy and [employer]ell  Prior Authorization for PROLIA  APPROVED  PA# [id number] Valid: 08/26/24-08/26/25

## 2024-09-02 NOTE — Progress Notes (Signed)
 No egg or soy allergy known to patient  No issues known to pt with past sedation with any surgeries or procedures Patient denies ever being told they had issues or difficulty with intubation  No FH of Malignant Hyperthermia Pt is not on diet pills Pt is not on  home 02  Pt is not on blood thinners  Pt denies issues with constipation  No A fib or A flutter Have any cardiac testing pending-- no  LOA: independent  Prep: sutab    Patient's chart reviewed by Norleen Schillings CNRA prior to previsit and patient appropriate for the [facility].  Previsit completed and red dot placed by patient's name on their procedure day (on provider's schedule).     PV completed with patient. Prep instructions sent via mychart and home address.

## 2024-09-02 NOTE — Telephone Encounter (Signed)
 Last Prolia  inj 08/30/24 Next Prolia  inj due 03/01/25

## 2024-09-02 NOTE — Telephone Encounter (Signed)
 Rx sent pt made aware

## 2024-09-03 ENCOUNTER — Ambulatory Visit: Admitting: Family Medicine

## 2024-09-03 ENCOUNTER — Encounter

## 2024-09-05 ENCOUNTER — Encounter: Payer: BC Managed Care – PPO | Admitting: Family Medicine

## 2024-09-08 NOTE — Telephone Encounter (Signed)
 Prolia  - re-determination was approved. Pt received 08/30/24.

## 2024-09-09 ENCOUNTER — Ambulatory Visit (INDEPENDENT_AMBULATORY_CARE_PROVIDER_SITE_OTHER): Admitting: Family Medicine

## 2024-09-09 ENCOUNTER — Encounter: Payer: Self-pay | Admitting: Family Medicine

## 2024-09-09 VITALS — BP 110/60 | HR 85 | Temp 98.1°F | Ht 65.75 in | Wt 115.1 lb

## 2024-09-09 DIAGNOSIS — M818 Other osteoporosis without current pathological fracture: Secondary | ICD-10-CM

## 2024-09-09 DIAGNOSIS — E039 Hypothyroidism, unspecified: Secondary | ICD-10-CM | POA: Diagnosis not present

## 2024-09-09 DIAGNOSIS — Z0000 Encounter for general adult medical examination without abnormal findings: Secondary | ICD-10-CM | POA: Diagnosis not present

## 2024-09-09 DIAGNOSIS — Z131 Encounter for screening for diabetes mellitus: Secondary | ICD-10-CM

## 2024-09-09 DIAGNOSIS — Z13220 Encounter for screening for lipoid disorders: Secondary | ICD-10-CM | POA: Diagnosis not present

## 2024-09-09 LAB — COMPREHENSIVE METABOLIC PANEL WITH GFR
ALT: 15 U/L (ref 0–35)
AST: 21 U/L (ref 0–37)
Albumin: 4.5 g/dL (ref 3.5–5.2)
Alkaline Phosphatase: 35 U/L — ABNORMAL LOW (ref 39–117)
BUN: 15 mg/dL (ref 6–23)
CO2: 28 meq/L (ref 19–32)
Calcium: 9.1 mg/dL (ref 8.4–10.5)
Chloride: 104 meq/L (ref 96–112)
Creatinine, Ser: 0.82 mg/dL (ref 0.40–1.20)
GFR: 74.67 mL/min (ref >60.00)
Glucose, Bld: 83 mg/dL (ref 70–99)
Potassium: 4.2 meq/L (ref 3.5–5.1)
Sodium: 141 meq/L (ref 135–145)
Total Bilirubin: 0.7 mg/dL (ref 0.2–1.2)
Total Protein: 7 g/dL (ref 6.0–8.3)

## 2024-09-09 LAB — VITAMIN D 25 HYDROXY (VIT D DEFICIENCY, FRACTURES): VITD: 51.32 ng/mL (ref 30.00–100.00)

## 2024-09-09 LAB — TSH: TSH: 2.79 u[IU]/mL (ref 0.35–5.50)

## 2024-09-09 LAB — LIPID PANEL
Cholesterol: 184 mg/dL (ref 0–200)
HDL: 65.9 mg/dL (ref >39.00)
LDL Cholesterol: 102 mg/dL — ABNORMAL HIGH (ref 0–99)
NonHDL: 118.01
Total CHOL/HDL Ratio: 3
Triglycerides: 78 mg/dL (ref 0.0–149.0)
VLDL: 15.6 mg/dL (ref 0.0–40.0)

## 2024-09-09 LAB — HEMOGLOBIN A1C: Hgb A1c MFr Bld: 5.9 % (ref 4.6–6.5)

## 2024-09-09 NOTE — Progress Notes (Signed)
 Subjective:    Sophia [name] is a 68 y.o. female who presents for a Welcome to Medicare exam.   Cardiac Risk Factors include: advanced age (>74men, >42 women)      Objective:    Today's Vitals   09/09/24 0821  BP: 110/60  Pulse: 85  Temp: 98.1 F (36.7 C)  TempSrc: Oral  SpO2: 97%  Weight: 115 lb 1.6 oz (52.2 kg)  Height: 5' 5.75 (1.67 m)  Body mass index is 18.72 kg/m.  Medications Outpatient Encounter Medications as of 09/09/2024  Medication Sig   aspirin EC 81 MG tablet Take 81 mg by mouth daily. Swallow whole. (Patient taking differently: Take 81 mg by mouth 3 (three) times a week. Swallow whole.)   Cholecalciferol (VITAMIN D3 PO) Take 2,000 Units by mouth daily.   Cyanocobalamin  (VITAMIN B-12 PO) Take 1,200 mcg by mouth daily.   denosumab  (PROLIA ) 60 MG/ML SOSY injection INJECT 60 MG UNDER THE SKIN ONCE FOR 1 DOSE   levothyroxine  (SYNTHROID ) 25 MCG tablet Take 1 tablet (25 mcg total) by mouth daily.   MAGNESIUM PO Take 200 mg by mouth daily. (Patient taking differently: Take 200 mg by mouth as needed (taking only when not eaten in diet).)   meloxicam  (MOBIC ) 7.5 MG tablet Take 1 tablet (7.5 mg total) by mouth daily. (Patient taking differently: Take 7.5 mg by mouth daily as needed.)   Menaquinone-7 (VITAMIN K2) 100 MCG CAPS Take 1 capsule by mouth daily.   Polyethylene Glycol 3350 (MIRALAX PO) Take 1 Capful by mouth every other day.   Probiotic Product (PROBIOTIC DAILY PO) Take by mouth.   psyllium (METAMUCIL) 58.6 % packet Take 1 packet by mouth every other day.   Sodium Sulfate-Mag Sulfate-KCl (SUTAB) 1479-225-188 MG TABS Take 12 tablets by mouth as directed.   Zinc Citrate (ZINC GUMMY PO) Take 2 Pieces by mouth daily. (Patient taking differently: Take 1 each by mouth daily.)   Facility-Administered Encounter Medications as of 09/09/2024  Medication   denosumab  (PROLIA ) injection 60 mg   [START ON 02/28/2025] denosumab  (PROLIA ) injection 60 mg   scopolamine  (TRANSDERM-SCOP) 1 MG/3DAYS 2 mg     History: Past Medical History:  Diagnosis Date   Chronic constipation    Colon polyp    Hypothyroidism    Interstitial cystitis    Past Surgical History:  Procedure Laterality Date   BLADDER INSTILLATION     2007,2012,2018 botox   BREAST CYST EXCISION Bilateral 1985, 1995   both benign, 1 on each breast   BREAST SURGERY     1985-right, mid 1990s-left benign (fibrocystic breast disease)   CATARACT EXTRACTION Left 2013   EYE SURGERY Left    done for floaters   INGUINAL HERNIA REPAIR Left 1986   LASIK Bilateral    2000    Family History  Problem Relation Age of Onset   Arthritis Mother    Hypertension Mother    Hypertension Father    Prostate cancer Father 23   Basal cell carcinoma Father    Hypertension Sister    Colon polyps Sister    Diabetes Mellitus II Maternal Grandmother    Uterine cancer Maternal Grandmother        ? not certain   Colon polyps Sister    Colon cancer Neg Hx    Esophageal cancer Neg Hx    Pancreatic cancer Neg Hx    Social History   Occupational History   Not on file  Tobacco Use   Smoking status:  Never   Smokeless tobacco: Never  Vaping Use   Vaping status: Never Used  Substance and Sexual Activity   Alcohol use: Yes    Comment: occasionally   Drug use: Not on file   Sexual activity: Not on file    Tobacco Counseling Counseling given: Not Answered   Immunizations and Health Maintenance Immunization History  Administered Date(s) Administered   Influenza,inj,Quad PF,6+ Mos 08/29/2021   Influenza-Unspecified 08/07/2019, 08/11/2020, 08/11/2022, 08/19/2023, 08/27/2024   PFIZER(Purple Top)SARS-COV-2 Vaccination 12/28/2019, 01/18/2020, 08/25/2020, 03/06/2021, 08/12/2022   PNEUMOCOCCAL CONJUGATE-20 09/05/2023   Pfizer Covid-19 Vaccine Bivalent Booster 44yrs & up 08/12/2021   Pfizer(Comirnaty)Fall Seasonal Vaccine 12 years and older 08/27/2024   Td 04/01/2003   Tdap 08/07/2018   Zoster  Recombinant(Shingrix) 12/26/2016, 04/10/2017   There are no preventive care reminders to display for this patient.   Activities of Daily Living    09/09/2024    8:46 AM  In your present state of health, do you have any difficulty performing the following activities:  Hearing? 0  Vision? 1  Difficulty concentrating or making decisions? 0  Walking or climbing stairs? 0  Dressing or bathing? 0  Doing errands, shopping? 0  Preparing Food and eating ? N  Using the Toilet? N  In the past six months, have you accidently leaked urine? N  Do you have problems with loss of bowel control? N  Managing your Medications? N  Managing your Finances? N  Housekeeping or managing your Housekeeping? N    Physical Exam   Physical Exam Vitals reviewed.  Constitutional:      Appearance: Normal appearance. She is well-groomed and normal weight.  HENT:     Right Ear: Tympanic membrane and ear canal normal.     Left Ear: Tympanic membrane and ear canal normal.     Mouth/Throat:     Mouth: Mucous membranes are moist.     Pharynx: No posterior oropharyngeal erythema.  Eyes:     Conjunctiva/sclera: Conjunctivae normal.  Neck:     Thyroid : No thyromegaly.  Cardiovascular:     Rate and Rhythm: Normal rate and regular rhythm.     Pulses: Normal pulses.     Heart sounds: S1 normal and S2 normal.  Pulmonary:     Effort: Pulmonary effort is normal.     Breath sounds: Normal breath sounds and air entry.  Abdominal:     General: Abdomen is flat. Bowel sounds are normal.     Palpations: Abdomen is soft.  Musculoskeletal:     Right lower leg: No edema.     Left lower leg: No edema.  Lymphadenopathy:     Cervical: No cervical adenopathy.  Neurological:     Mental Status: She is alert and oriented to person, place, and time. Mental status is at baseline.     Gait: Gait is intact.  Psychiatric:        Mood and Affect: Mood and affect normal.        Speech: Speech normal.        Behavior: Behavior  normal.        Judgment: Judgment normal.    (optional), or other factors deemed appropriate based on the beneficiary's medical and social history and current clinical standards.   Advanced Directives: Does Patient Have a Medical Advance Directive?: Yes Type of Advance Directive: Healthcare Power of Attorney, Living will Does patient want to make changes to medical advance directive?: No - Patient declined Copy of Healthcare Power of Attorney in Chart?: No -  copy requested  EKG:  normal EKG, normal sinus rhythm, no previous tracings for comparison      Assessment:    This is a routine wellness examination for this patient . Normal findings.   Vision/Hearing screen Hearing Screening   2000Hz  4000Hz   Right ear Pass Pass  Left ear  Pass   Vision Screening   Right eye Left eye Both eyes  Without correction     With correction 20/32 20/20      Goals   None     Depression Screen    09/09/2024    8:24 AM 09/05/2023    8:07 AM 09/02/2022    1:33 PM 06/14/2022    8:31 AM  PHQ 2/9 Scores  PHQ - 2 Score 0 0 0 0  PHQ- 9 Score 0  0 0     Fall Risk    09/09/2024    8:23 AM  Fall Risk   Falls in the past year? 1  Number falls in past yr: 0  Injury with Fall? 1  Risk for fall due to : Impaired balance/gait  Follow up Falls evaluation completed    Cognitive Function:        09/09/2024    8:52 AM  6CIT Screen  What Year? 0 points  What month? 0 points  What time? 0 points  Count back from 20 0 points  Months in reverse 0 points  Repeat phrase 0 points  Total Score 0 points    Patient Care Team: Ozell Heron HERO, MD as PCP - General (Family Medicine) Estelle Service, MD as Consulting Physician (Obstetrics and Gynecology) Delight Rosaline NOVAK, [place] (Physician Assistant) Octavia Bruckner, MD as Consulting Physician (Ophthalmology)     Plan:   Welcome to Medicare visit, normal findings today.   I have personally reviewed and noted the following in  the patient's chart:   Medical and social history Use of alcohol, tobacco or illicit drugs  Current medications and supplements including opioid prescriptions. Patient is not currently taking opioid prescriptions. Functional ability and status Nutritional status Physical activity Advanced directives List of other physicians Hospitalizations, surgeries, and ER visits in previous 12 months Vitals Screenings to include cognitive, depression, and falls Referrals and appointments  In addition, I have reviewed and discussed with patient certain preventive protocols, quality metrics, and best practice recommendations. A written personalized care plan for preventive services as well as general preventive health recommendations were provided to patient.     Heron HERO Ozell, MD 09/09/2024

## 2024-09-09 NOTE — Progress Notes (Unsigned)
 [facility] [address] [place] [address] Phone: [phone] Subjective:   Sophia [name], am serving as a scribe for Dr. Arthea Claudene.  I'm seeing this patient by the request  of:  Ozell Heron HERO, MD  CC: Foot and left shoulder pain  YEP:Dlagzrupcz  04/30/2024 Significant breakdown of the transverse arch noted.  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  Since referral to have these custom orthotics made and I do think will respond well to the conservative therapy.     Significant improvement at this time.  Discussed icing regimen.  Will continue to monitor but is no pain whatsoever at this time.      Update 09/10/2024 Sophia [name] is a 68 y.o. female coming in with complaint of foot and L shoulder pain. Patient states that she is doing well. Uses meloxicam  3x a week.   Did not get custom orthotics yet due to change in insurance.       Past Medical History:  Diagnosis Date   Chronic constipation    Colon polyp    Hypothyroidism    Interstitial cystitis    Past Surgical History:  Procedure Laterality Date   BLADDER INSTILLATION     2007,2012,2018 botox   BREAST CYST EXCISION Bilateral 1985, 1995   both benign, 1 on each breast   BREAST SURGERY     1985-right, mid 1990s-left benign (fibrocystic breast disease)   CATARACT EXTRACTION Left 2013   EYE SURGERY Left    done for floaters   INGUINAL HERNIA REPAIR Left 1986   LASIK Bilateral    2000   Social History   Socioeconomic History   Marital status: Married    Spouse name: Not on file   Number of children: Not on file   Years of education: Not on file   Highest education level: Bachelor's degree (e.g., BA, AB, BS)  Occupational History   Not on file  Tobacco Use   Smoking status: Never   Smokeless tobacco: Never  Vaping Use    Vaping status: Never Used  Substance and Sexual Activity   Alcohol use: Yes    Comment: occasionally   Drug use: Not on file   Sexual activity: Not on file  Other Topics Concern   Not on file  Social History Narrative   Not on file   Social Drivers of Health   Financial Resource Strain: Low Risk  (09/07/2024)   Overall Financial Resource Strain (CARDIA)    Difficulty of Paying Living Expenses: Not hard at all  Food Insecurity: No Food Insecurity (09/07/2024)   Hunger Vital Sign    Worried About Running Out of Food in the Last Year: Never true    Ran Out of Food in the Last Year: Never true  Transportation Needs: No Transportation Needs (09/07/2024)   PRAPARE - Administrator, Civil Service (Medical): No    Lack of Transportation (Non-Medical): No  Physical Activity: Sufficiently Active (09/07/2024)   Exercise Vital Sign    Days of Exercise per Week: 7 days    Minutes of Exercise per Session: 60 min  Stress: No Stress Concern Present (09/07/2024)   [employer] of Occupational Health - Occupational Stress Questionnaire    Feeling of Stress: Not at all  Social Connections: Socially Integrated (09/07/2024)   Social Connection and Isolation Panel  Frequency of Communication with Friends and Family: More than three times a week    Frequency of Social Gatherings with Friends and Family: More than three times a week    Attends Religious Services: More than 4 times per year    Active Member of [employer] or Organizations: Yes    Attends Engineer, structural: More than 4 times per year    Marital Status: Married   Allergies  Allergen Reactions   Sulfa Antibiotics Hives   Family History  Problem Relation Age of Onset   Arthritis Mother    Hypertension Mother    Hypertension Father    Prostate cancer Father 21   Basal cell carcinoma Father    Hypertension Sister    Colon polyps Sister    Diabetes Mellitus II Maternal Grandmother    Uterine cancer  Maternal Grandmother        ? not certain   Colon polyps Sister    Colon cancer Neg Hx    Esophageal cancer Neg Hx    Pancreatic cancer Neg Hx     Current Outpatient Medications (Endocrine & Metabolic):    denosumab  (PROLIA ) 60 MG/ML SOSY injection, INJECT 60 MG UNDER THE SKIN ONCE FOR 1 DOSE   levothyroxine  (SYNTHROID ) 25 MCG tablet, Take 1 tablet (25 mcg total) by mouth daily.  Current Facility-Administered Medications (Endocrine & Metabolic):    denosumab  (PROLIA ) injection 60 mg   [START ON 02/28/2025] denosumab  (PROLIA ) injection 60 mg      Current Outpatient Medications (Analgesics):    aspirin EC 81 MG tablet, Take 81 mg by mouth daily. Swallow whole. (Patient taking differently: Take 81 mg by mouth 3 (three) times a week. Swallow whole.)   meloxicam  (MOBIC ) 7.5 MG tablet, Take 1 tablet (7.5 mg total) by mouth daily. (Patient taking differently: Take 7.5 mg by mouth daily as needed.)   Current Outpatient Medications (Hematological):    Cyanocobalamin  (VITAMIN B-12 PO), Take 1,200 mcg by mouth daily.   Current Outpatient Medications (Other):    Cholecalciferol (VITAMIN D3 PO), Take 2,000 Units by mouth daily.   MAGNESIUM PO, Take 200 mg by mouth daily. (Patient taking differently: Take 200 mg by mouth as needed (taking only when not eaten in diet).)   Menaquinone-7 (VITAMIN K2) 100 MCG CAPS, Take 1 capsule by mouth daily.   Polyethylene Glycol 3350 (MIRALAX PO), Take 1 Capful by mouth every other day.   Probiotic Product (PROBIOTIC DAILY PO), Take by mouth.   psyllium (METAMUCIL) 58.6 % packet, Take 1 packet by mouth every other day.   Sodium Sulfate-Mag Sulfate-KCl (SUTAB) [phone] MG TABS, Take 12 tablets by mouth as directed.   Zinc Citrate (ZINC GUMMY PO), Take 2 Pieces by mouth daily. (Patient taking differently: Take 1 each by mouth daily.)  Current Facility-Administered Medications (Other):    scopolamine (TRANSDERM-SCOP) 1 MG/3DAYS 2 mg   Reviewed prior  external information including notes and imaging from  primary care provider As well as notes that were available from care everywhere and other healthcare systems.  Past medical history, social, surgical and family history all reviewed in electronic medical record.  No pertanent information unless stated regarding to the chief complaint.   Review of Systems:  No headache, visual changes, nausea, vomiting, diarrhea, constipation, dizziness, abdominal pain, skin rash, fevers, chills, night sweats, weight loss, swollen lymph nodes, body aches, joint swelling, chest pain, shortness of breath, mood changes. POSITIVE muscle aches  Objective  Blood pressure 106/64, pulse 74, height 5' 5.75 (  1.67 m), weight 117 lb (53.1 kg), SpO2 98%.   General: No apparent distress alert and oriented x3 mood and affect normal, dressed appropriately.  HEENT: Pupils equal, extraocular movements intact  Respiratory: Patient's speak in full sentences and does not appear short of breath  Cardiovascular: No lower extremity edema, non tender, no erythema  Foot exam shows the patient is doing relatively well but still has the significant breakdown of the transverse arch and overpronation of the hindfoot. Back exam mild loss of lordosis but able to get out of the seated position without any significant difficulty at this time.  Shoulder exam has great range of motion at the moment.  Negative crossover.    Impression and Recommendations:

## 2024-09-10 ENCOUNTER — Ambulatory Visit: Admitting: Family Medicine

## 2024-09-10 ENCOUNTER — Ambulatory Visit: Payer: Self-pay | Admitting: Family Medicine

## 2024-09-10 ENCOUNTER — Other Ambulatory Visit: Payer: Self-pay

## 2024-09-10 VITALS — BP 106/64 | HR 74 | Ht 65.75 in | Wt 117.0 lb

## 2024-09-10 DIAGNOSIS — M79672 Pain in left foot: Secondary | ICD-10-CM | POA: Diagnosis not present

## 2024-09-10 DIAGNOSIS — M216X9 Other acquired deformities of unspecified foot: Secondary | ICD-10-CM | POA: Diagnosis not present

## 2024-09-10 DIAGNOSIS — M818 Other osteoporosis without current pathological fracture: Secondary | ICD-10-CM | POA: Diagnosis not present

## 2024-09-10 DIAGNOSIS — M25412 Effusion, left shoulder: Secondary | ICD-10-CM | POA: Diagnosis not present

## 2024-09-10 NOTE — Assessment & Plan Note (Signed)
 Doing significantly well, 4 months out from previous injection.  No need for any other changes at the moment.  Follow-up with me as needed for this problem

## 2024-09-10 NOTE — Patient Instructions (Addendum)
 [facility] [phone] Write me when you get bone density See me in 3-4 months

## 2024-09-10 NOTE — Assessment & Plan Note (Signed)
 Schedule within the next month to get the bone density and we will see how patient is responding to the Prolia .

## 2024-09-10 NOTE — Assessment & Plan Note (Signed)
 Repeat referral for custom orthotics.  Hopefully this will be beneficial long-term.

## 2024-09-11 ENCOUNTER — Ambulatory Visit: Admission: RE | Admit: 2024-09-11 | Source: Ambulatory Visit

## 2024-09-12 ENCOUNTER — Encounter: Payer: Self-pay | Admitting: Pediatrics

## 2024-09-13 ENCOUNTER — Ambulatory Visit
Admission: RE | Admit: 2024-09-13 | Discharge: 2024-09-13 | Disposition: A | Discharge status: Home / Self Care | Payer: Self-pay | Source: Ambulatory Visit | Attending: Obstetrics and Gynecology | Admitting: Obstetrics and Gynecology

## 2024-09-13 DIAGNOSIS — R92343 Mammographic extreme density, bilateral breasts: Secondary | ICD-10-CM

## 2024-09-13 MED ORDER — GADOPICLENOL 0.5 MMOL/ML IV SOLN
5.0000 mL | Freq: Once | INTRAVENOUS | Status: AC | PRN
  Administered 2024-09-13: 5 mL via INTRAVENOUS

## 2024-09-16 ENCOUNTER — Telehealth: Payer: Self-pay | Admitting: Pediatrics

## 2024-09-16 ENCOUNTER — Encounter: Payer: Self-pay | Admitting: Family Medicine

## 2024-09-16 NOTE — Telephone Encounter (Signed)
 Inbound call from patient stating she would like to know how much her insurance would cover for upcoming procedure on 09/23/24 Requesting a call back  Please advise  Thank you

## 2024-09-19 ENCOUNTER — Other Ambulatory Visit

## 2024-09-19 DIAGNOSIS — K080 Exfoliation of teeth due to systemic causes: Secondary | ICD-10-CM | POA: Diagnosis not present

## 2024-09-22 NOTE — Progress Notes (Unsigned)
 [facility] History and Physical   Primary Care Physician:  Ozell Heron HERO, MD   Reason for Procedure:  History of adenomatous colon polyps, family history of colon polyps and multiple first-degree relatives  Plan:    Colonoscopy     HPI: Sophia [name] is a 68 y.o. female undergoing colonoscopy to follow-up a history of adenomatous colon polyp as well as a family history of colon polyps and multiple first-degree family members.  Sophia [name] reports having a lifetime history of 3 colonoscopies: -- 2009: This may have been normal or with benign polyps -- 2019: 1 tubular adenoma and 1 hyperplastic polyp -- 2022: 2 hyperplastic polyps -procedure moderately difficult due to looping of colonoscope  There is a family history of colon polyps in 2 of her sisters and a brother No family history of colorectal cancer  Endorses symptoms of constipation for which she uses MiraLAX on a regular basis No blood or mucus in stool   Past Medical History:  Diagnosis Date   Chronic constipation    Colon polyp    Hypothyroidism    Interstitial cystitis     Past Surgical History:  Procedure Laterality Date   BLADDER INSTILLATION     2007,2012,2018 botox   BREAST CYST EXCISION Bilateral 1985, 1995   both benign, 1 on each breast   BREAST SURGERY     1985-right, mid 1990s-left benign (fibrocystic breast disease)   CATARACT EXTRACTION Left 2013   EYE SURGERY Left    done for floaters   INGUINAL HERNIA REPAIR Left 1986   LASIK Bilateral    2000    Prior to Admission medications   Medication Sig Start Date End Date Taking? Authorizing Provider  aspirin EC 81 MG tablet Take 81 mg by mouth daily. Swallow whole. Patient taking differently: Take 81 mg by mouth 3 (three) times a week. Swallow whole.    [provider]  Cholecalciferol (VITAMIN D3 PO) Take 2,000 Units by mouth daily.    [provider]  Cyanocobalamin  (VITAMIN B-12 PO) Take 1,200 mcg by  mouth daily.    [provider]  denosumab  (PROLIA ) 60 MG/ML SOSY injection INJECT 60 MG UNDER THE SKIN ONCE FOR 1 DOSE 01/17/23   Trixie File, MD  levothyroxine  (SYNTHROID ) 25 MCG tablet Take 1 tablet (25 mcg total) by mouth daily. 01/12/24   Trixie File, MD  MAGNESIUM PO Take 200 mg by mouth daily. Patient taking differently: Take 200 mg by mouth as needed (taking only when not eaten in diet).    [provider]  meloxicam  (MOBIC ) 7.5 MG tablet Take 1 tablet (7.5 mg total) by mouth daily. Patient taking differently: Take 7.5 mg by mouth daily as needed. 01/23/24   Smith, Zachary M, DO  Menaquinone-7 (VITAMIN K2) 100 MCG CAPS Take 1 capsule by mouth daily.    [provider]  Polyethylene Glycol 3350 (MIRALAX PO) Take 1 Capful by mouth every other day.    [provider]  Probiotic Product (PROBIOTIC DAILY PO) Take by mouth.    [provider]  psyllium (METAMUCIL) 58.6 % packet Take 1 packet by mouth every other day.    [provider]  Sodium Sulfate-Mag Sulfate-KCl (SUTAB) 1479-225-188 MG TABS Take 12 tablets by mouth as directed. 09/02/24   Suzann Sophia HERO, MD  Zinc Citrate (ZINC GUMMY PO) Take 2 Pieces by mouth daily. Patient taking differently: Take 1 each by mouth daily.    [provider]    Current Outpatient Medications  Medication Sig Dispense Refill   aspirin EC 81 MG tablet Take 81 mg by mouth daily. Swallow whole. (Patient taking differently: Take 81 mg by mouth 3 (three) times a week. Swallow whole.)     Cholecalciferol (VITAMIN D3 PO) Take 2,000 Units by mouth daily.     Cyanocobalamin  (VITAMIN B-12 PO) Take 1,200 mcg by mouth daily.     levothyroxine  (SYNTHROID ) 25 MCG tablet Take 1 tablet (25 mcg total) by mouth daily. 90 tablet 3   MAGNESIUM PO Take 200 mg by mouth daily. (Patient taking differently: Take 200 mg by mouth as needed (taking only when not eaten in diet).)     Menaquinone-7 (VITAMIN K2) 100  MCG CAPS Take 1 capsule by mouth daily.     denosumab  (PROLIA ) 60 MG/ML SOSY injection INJECT 60 MG UNDER THE SKIN ONCE FOR 1 DOSE 1 mL 0   meloxicam  (MOBIC ) 7.5 MG tablet Take 1 tablet (7.5 mg total) by mouth daily. (Patient taking differently: Take 7.5 mg by mouth daily as needed.) 180 tablet 1   Polyethylene Glycol 3350 (MIRALAX PO) Take 1 Capful by mouth every other day.     Probiotic Product (PROBIOTIC DAILY PO) Take by mouth.     psyllium (METAMUCIL) 58.6 % packet Take 1 packet by mouth every other day.     Zinc Citrate (ZINC GUMMY PO) Take 2 Pieces by mouth daily. (Patient taking differently: Take 1 each by mouth daily.)     Current Facility-Administered Medications  Medication Dose Route Frequency Provider Last Rate Last Admin   0.9 %  sodium chloride  infusion  500 mL Intravenous Continuous Hadasa Gasner, Sophia HERO, MD       denosumab  (PROLIA ) injection 60 mg  60 mg Subcutaneous Once Gherghe, Cristina, MD       NOREEN ON 02/28/2025] denosumab  (PROLIA ) injection 60 mg  60 mg Subcutaneous Once Gherghe, Cristina, MD       scopolamine (TRANSDERM-SCOP) 1 MG/3DAYS 2 mg  2 patch Transdermal Q72H Suzann Sophia HERO, MD        Allergies as of 09/23/2024 - Review Complete 09/23/2024  Allergen Reaction Noted   Sulfa antibiotics Hives 01/29/2020    Family History  Problem Relation Age of Onset   Arthritis Mother    Hypertension Mother    Hypertension Father    Prostate cancer Father 23   Basal cell carcinoma Father    Hypertension Sister    Colon polyps Sister    Diabetes Mellitus II Maternal Grandmother    Uterine cancer Maternal Grandmother        ? not certain   Colon polyps Sister    Colon cancer Neg Hx    Esophageal cancer Neg Hx    Pancreatic cancer Neg Hx     Social History   Socioeconomic History   Marital status: Married    Spouse name: Not on file   Number of children: Not on file   Years of education: Not on file   Highest education level: Bachelor's degree (e.g., BA, AB,  BS)  Occupational History   Not on file  Tobacco Use   Smoking status: Never   Smokeless tobacco: Never  Vaping Use   Vaping status: Never Used  Substance and Sexual Activity   Alcohol use: Yes    Comment: occasionally   Drug use: Never   Sexual activity: Not on file  Other Topics Concern   Not on file  Social History Narrative   Not on file   Social Drivers of  Health   Financial Resource Strain: Low Risk  (09/07/2024)   Overall Financial Resource Strain (CARDIA)    Difficulty of Paying Living Expenses: Not hard at all  Food Insecurity: No Food Insecurity (09/07/2024)   Hunger Vital Sign    Worried About Running Out of Food in the Last Year: Never true    Ran Out of Food in the Last Year: Never true  Transportation Needs: No Transportation Needs (09/07/2024)   PRAPARE - Administrator, Civil Service (Medical): No    Lack of Transportation (Non-Medical): No  Physical Activity: Sufficiently Active (09/07/2024)   Exercise Vital Sign    Days of Exercise per Week: 7 days    Minutes of Exercise per Session: 60 min  Stress: No Stress Concern Present (09/07/2024)   Sophia [name] of Occupational Health - Occupational Stress Questionnaire    Feeling of Stress: Not at all  Social Connections: Socially Integrated (09/07/2024)   Social Connection and Isolation Panel    Frequency of Communication with Friends and Family: More than three times a week    Frequency of Social Gatherings with Friends and Family: More than three times a week    Attends Religious Services: More than 4 times per year    Active Member of [employer] or Organizations: Yes    Attends Engineer, Structural: More than 4 times per year    Marital Status: Married  Catering Manager Violence: Not At Risk (09/09/2024)   Humiliation, Afraid, Rape, and Kick questionnaire    Fear of Current or Ex-Partner: No    Emotionally Abused: No    Physically Abused: No    Sexually Abused: No    Review of  Systems:  All other review of systems negative except as mentioned in the HPI.  Physical Exam: Vital signs BP 119/78   Pulse 87   Temp 98.3 F (36.8 C)   Ht 5' 5 (1.651 m)   Wt 115 lb (52.2 kg)   SpO2 100%   BMI 19.14 kg/m   General:   Alert,  Well-developed, well-nourished, pleasant and cooperative in NAD Airway:  Mallampati 1 Lungs:  Clear throughout to auscultation.   Heart:  Regular rate and rhythm; no murmurs, clicks, rubs,  or gallops. Abdomen:  Soft, nontender and nondistended. Normal bowel sounds.   Neuro/Psych:  Normal mood and affect. A and O x 3  Sophia Hausen, MD [facility]

## 2024-09-23 ENCOUNTER — Ambulatory Visit: Admitting: Pediatrics

## 2024-09-23 ENCOUNTER — Encounter: Payer: Self-pay | Admitting: Pediatrics

## 2024-09-23 VITALS — BP 119/78 | HR 65 | Temp 98.3°F | Resp 11 | Ht 65.0 in | Wt 115.0 lb

## 2024-09-23 DIAGNOSIS — Z1211 Encounter for screening for malignant neoplasm of colon: Secondary | ICD-10-CM

## 2024-09-23 DIAGNOSIS — Z83719 Family history of colon polyps, unspecified: Secondary | ICD-10-CM

## 2024-09-23 DIAGNOSIS — K648 Other hemorrhoids: Secondary | ICD-10-CM

## 2024-09-23 DIAGNOSIS — Z860101 Personal history of adenomatous and serrated colon polyps: Secondary | ICD-10-CM

## 2024-09-23 DIAGNOSIS — D120 Benign neoplasm of cecum: Secondary | ICD-10-CM | POA: Diagnosis not present

## 2024-09-23 DIAGNOSIS — K644 Residual hemorrhoidal skin tags: Secondary | ICD-10-CM | POA: Diagnosis not present

## 2024-09-23 DIAGNOSIS — Z860100 Personal history of colon polyps, unspecified: Secondary | ICD-10-CM

## 2024-09-23 MED ORDER — SODIUM CHLORIDE 0.9 % IV SOLN: 500.0000 mL | INTRAVENOUS | Status: DC

## 2024-09-23 NOTE — Patient Instructions (Signed)
  Handouts on polyps & hemorrhoids given to you today.   Await pathology results on polyps removed   Continue previous diet & medications     YOU HAD AN ENDOSCOPIC PROCEDURE TODAY AT THE  ENDOSCOPY CENTER:   Refer to the procedure report that was given to you for any specific questions about what was found during the examination.  If the procedure report does not answer your questions, please call your gastroenterologist to clarify.  If you requested that your care partner not be given the details of your procedure findings, then the procedure report has been included in a sealed envelope for you to review at your convenience later.  YOU SHOULD EXPECT: Some feelings of bloating in the abdomen. Passage of more gas than usual.  Walking can help get rid of the air that was put into your GI tract during the procedure and reduce the bloating. If you had a lower endoscopy (such as a colonoscopy or flexible sigmoidoscopy) you may notice spotting of blood in your stool or on the toilet paper. If you underwent a bowel prep for your procedure, you may not have a normal bowel movement for a few days.  Please Note:  You might notice some irritation and congestion in your nose or some drainage.  This is from the oxygen used during your procedure.  There is no need for concern and it should clear up in a day or so.  SYMPTOMS TO REPORT IMMEDIATELY:  Following lower endoscopy (colonoscopy or flexible sigmoidoscopy):  Excessive amounts of blood in the stool  Significant tenderness or worsening of abdominal pains  Swelling of the abdomen that is new, acute  Fever of 100F or higher   For urgent or emergent issues, a gastroenterologist can be reached at any hour by calling [phone]. Do not use MyChart messaging for urgent concerns.    DIET:  We do recommend a small meal at first, but then you may proceed to your regular diet.  Drink plenty of fluids but you should avoid alcoholic beverages for  24 hours.  ACTIVITY:  You should plan to take it easy for the rest of today and you should NOT DRIVE or use heavy machinery until tomorrow (because of the sedation medicines used during the test).    FOLLOW UP: Our staff will call the number listed on your records the next business day following your procedure.  We will call around 7:15- 8:00 am to check on you and address any questions or concerns that you may have regarding the information given to you following your procedure. If we do not reach you, we will leave a message.     If any biopsies were taken you will be contacted by phone or by letter within the next 1-3 weeks.  Please call us  at [phone] if you have not heard about the biopsies in 3 weeks.    SIGNATURES/CONFIDENTIALITY: You and/or your care partner have signed paperwork which will be entered into your electronic medical record.  These signatures attest to the fact that that the information above on your After Visit Summary has been reviewed and is understood.  Full responsibility of the confidentiality of this discharge information lies with you and/or your care-partner.Continue previous diet & medications

## 2024-09-23 NOTE — Progress Notes (Signed)
 To PACU via stretcher, sedated, good respiratory effort, VSS.

## 2024-09-23 NOTE — Progress Notes (Signed)
 Called to room to assist during endoscopic procedure.  Patient ID and intended procedure confirmed with present staff. Received instructions for my participation in the procedure from the performing physician.

## 2024-09-23 NOTE — Op Note (Signed)
 [facility] Patient Name: Sophia [name] Procedure Date: 09/23/2024 7:37 AM MRN: [id number] Endoscopist: Inocente Hausen , MD, [id number] Age: 68 Referring MD:  Date of Birth: 1956/01/23 Gender: Female Account #: [id number] Procedure:                Colonoscopy Indications:              High risk colon cancer surveillance: Personal                            history of non-advanced adenoma, Last colonoscopy:                            June 2022, Family history of colonic polyps in                            multiple first-degree relatives; last colonoscopy                            performed in 2022 revealed 2 hyperplastic polyps;                            Last tubular adenoma identified on colonoscopy 2019 Medicines:                Monitored Anesthesia Care Procedure:                Pre-Anesthesia Assessment:                           - Prior to the procedure, a History and Physical                            was performed, and patient medications and                            allergies were reviewed. The patient's tolerance of                            previous anesthesia was also reviewed. The risks                            and benefits of the procedure and the sedation                            options and risks were discussed with the patient.                            All questions were answered, and informed consent                            was obtained. Prior Anticoagulants: The patient has                            taken no anticoagulant or antiplatelet agents. ASA  Grade Assessment: II - A patient with mild systemic                            disease. After reviewing the risks and benefits,                            the patient was deemed in satisfactory condition to                            undergo the procedure.                           After obtaining informed consent, the colonoscope                            was  passed under direct vision. Throughout the                            procedure, the patient's blood pressure, pulse, and                            oxygen saturations were monitored continuously. The                            [id number]-HQ190L Colonoscope [id number] was introduced                            through the anus and advanced to the cecum,                            identified by appendiceal orifice and ileocecal                            valve. The colonoscopy was somewhat difficult due                            to significant looping. Successful completion of                            the procedure was aided by straightening and                            shortening the scope to obtain bowel loop                            reduction. The patient tolerated the procedure                            well. The quality of the bowel preparation was                            good. The ileocecal valve, appendiceal orifice, and  rectum were photographed. Scope In: 8:06:50 AM Scope Out: 8:22:28 AM Scope Withdrawal Time: 0 hours 9 minutes 38 seconds  Total Procedure Duration: 0 hours 15 minutes 38 seconds  Findings:                 Skin tags were found on perianal exam.                           The digital rectal exam was normal. Pertinent                            negatives include normal sphincter tone and no                            palpable rectal lesions.                           A 3 mm polyp versus lymph node was found in the                            cecum. The polyp was sessile. The polyp was removed                            with a cold biopsy forceps. Resection and retrieval                            were complete.                           Internal hemorrhoids were found during retroflexion. Complications:            No immediate complications. Estimated blood loss:                            Minimal. Estimated Blood Loss:     Estimated blood  loss was minimal. Impression:               - Perianal skin tags found on perianal exam.                           - One 3 mm polyp versus lymph node in the cecum,                            removed with a cold biopsy forceps. Resected and                            retrieved.                           - Internal hemorrhoids. Recommendation:           - Discharge patient to home (ambulatory).                           - Await pathology results.                           -  Repeat colonoscopy in 5 years for surveillance                            given previous history of a tubular adenoma and                            family history of colon polyps and multiple                            first-degree family members.                           - The findings and recommendations were discussed                            with the patient's family.                           - Patient has a contact number available for                            emergencies. The signs and symptoms of potential                            delayed complications were discussed with the                            patient. Return to normal activities tomorrow.                            Written discharge instructions were provided to the                            patient. Inocente Hausen, MD 09/23/2024 8:28:24 AM This report has been signed electronically.

## 2024-09-23 NOTE — Progress Notes (Signed)
 Pt's states no medical or surgical changes since previsit or office visit.

## 2024-09-24 ENCOUNTER — Telehealth: Payer: Self-pay

## 2024-09-24 NOTE — Telephone Encounter (Signed)
  Follow up Call-     09/23/2024    7:20 AM  Call back number  Post procedure Call Back phone  # [phone]  Permission to leave phone message Yes     Patient questions:  Do you have a fever, pain , or abdominal swelling? No. Pain Score  0 *  Have you tolerated food without any problems? Yes.    Have you been able to return to your normal activities? Yes.    Do you have any questions about your discharge instructions: Diet   No. Medications  No. Follow up visit  No.  Do you have questions or concerns about your Care? No.  Actions: * If pain score is 4 or above: No action needed, pain <4.

## 2024-09-25 ENCOUNTER — Encounter: Payer: Self-pay | Admitting: Pediatrics

## 2024-09-25 ENCOUNTER — Ambulatory Visit: Payer: Self-pay | Admitting: Pediatrics

## 2024-09-25 LAB — SURGICAL PATHOLOGY

## 2024-09-26 NOTE — Telephone Encounter (Signed)
 See result note from yesterday. Pt is aware of recommendations.

## 2024-09-30 ENCOUNTER — Ambulatory Visit: Payer: Self-pay | Admitting: Internal Medicine

## 2024-09-30 ENCOUNTER — Ambulatory Visit (HOSPITAL_BASED_OUTPATIENT_CLINIC_OR_DEPARTMENT_OTHER)
Admission: RE | Admit: 2024-09-30 | Discharge: 2024-09-30 | Disposition: A | Discharge status: Home / Self Care | Source: Ambulatory Visit | Attending: Internal Medicine | Admitting: Internal Medicine

## 2024-09-30 DIAGNOSIS — M810 Age-related osteoporosis without current pathological fracture: Secondary | ICD-10-CM | POA: Diagnosis not present

## 2024-09-30 DIAGNOSIS — Z780 Asymptomatic menopausal state: Secondary | ICD-10-CM | POA: Diagnosis not present

## 2024-09-30 DIAGNOSIS — M8589 Other specified disorders of bone density and structure, multiple sites: Secondary | ICD-10-CM | POA: Diagnosis not present

## 2024-10-03 ENCOUNTER — Encounter: Payer: Self-pay | Admitting: Family Medicine

## 2024-10-09 ENCOUNTER — Encounter

## 2024-10-10 ENCOUNTER — Ambulatory Visit

## 2024-10-10 VITALS — BP 118/62 | Ht 65.5 in | Wt 115.0 lb

## 2024-10-10 DIAGNOSIS — M79672 Pain in left foot: Secondary | ICD-10-CM

## 2024-10-10 DIAGNOSIS — M216X9 Other acquired deformities of unspecified foot: Secondary | ICD-10-CM

## 2024-10-10 DIAGNOSIS — M21619 Bunion of unspecified foot: Secondary | ICD-10-CM

## 2024-10-10 DIAGNOSIS — M79671 Pain in right foot: Secondary | ICD-10-CM | POA: Diagnosis not present

## 2024-10-10 NOTE — Progress Notes (Addendum)
 [facility] A Department of [facility]   PCP: Ozell Heron HERO, MD  CHIEF COMPLAINT: Bilateral feet pain,  HPI: Patient is a pleasant 68 y.o. Sophia [name] who presents today for fitting of custom orthotics.  Patient was referred by Dr. Arthea Sharps given chronic foot pain.  At last office visit with him on 09/10/2024 Dr. Sharps noted significant breakdown of the transverse arch noted. 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   On my evaluation today, patient states since her last visit her pain has persisted.  Says recently the pain has primarily been on her dorsal aspect of her midfoot as well as her medial aspect of bunion.  Excited to get fit for custom orthotics.  No additional questions or concerns at this time.   PMH:  Past Medical History:  Diagnosis Date   Chronic constipation    Colon polyp    Hypothyroidism    Interstitial cystitis     Patient Active Problem List   Diagnosis Date Noted   Greater trochanteric bursitis of left hip 10/26/2022   Synovitis of hand 10/26/2022   Routine history and physical examination of adult 09/07/2022   Acute sinusitis 08/29/2022   Effusion of joint of left shoulder 06/15/2022   AC joint pain 06/15/2022   Ganglion and cyst of synovium, tendon and bursa 07/03/2021   Loss of transverse plantar arch 07/03/2021   Metatarsalgia of right foot 04/26/2021   Pain in left foot 04/26/2021   Insomnia 03/19/2021   Hypothyroid 08/31/2020   Osteoporosis 03/01/2020   Interstitial cystitis 03/01/2020   Basal cell carcinoma 03/01/2020   Bunion 12/25/2019    PSurg:  Past Surgical History:  Procedure Laterality Date   BLADDER INSTILLATION     2007,2012,2018 botox   BREAST CYST EXCISION Bilateral 1985, 1995   both benign, 1 on each breast   BREAST SURGERY     1985-right, mid  1990s-left benign (fibrocystic breast disease)   CATARACT EXTRACTION Left 2013   EYE SURGERY Left    done for floaters   INGUINAL HERNIA REPAIR Left 1986   LASIK Bilateral    2000    Allergies: Sulfa antibiotics  Meds:  Previous Medications   ASPIRIN EC 81 MG TABLET    Take 81 mg by mouth daily. Swallow whole.   CHOLECALCIFEROL (VITAMIN D3 PO)    Take 2,000 Units by mouth daily.   CYANOCOBALAMIN  (VITAMIN B-12 PO)    Take 1,200 mcg by mouth daily.   DENOSUMAB  (PROLIA ) 60 MG/ML SOSY INJECTION    INJECT 60 MG UNDER THE SKIN ONCE FOR 1 DOSE   LEVOTHYROXINE  (SYNTHROID ) 25 MCG TABLET    Take 1 tablet (25 mcg total) by mouth daily.   MAGNESIUM PO    Take 200 mg by mouth daily.   MELOXICAM  (MOBIC ) 7.5 MG TABLET    Take 1 tablet (7.5 mg total) by mouth daily.   MENAQUINONE-7 (VITAMIN K2) 100 MCG CAPS    Take 1 capsule by mouth daily.   POLYETHYLENE GLYCOL 3350 (MIRALAX PO)    Take 1 Capful by mouth every other day.   PROBIOTIC PRODUCT (PROBIOTIC DAILY PO)    Take by mouth.   PSYLLIUM (METAMUCIL) 58.6 % PACKET    Take 1 packet by mouth every other day.   ZINC CITRATE (ZINC GUMMY PO)    Take 2 Pieces by mouth  daily.    Social:  Social History   Tobacco Use   Smoking status: Never   Smokeless tobacco: Never  Substance Use Topics   Alcohol use: Yes    Comment: occasionally    REVIEW OF SYSTEMS:  ROS negative except as noted in HPI above   Objective Exam:  Vitals:   10/10/24 1136  BP: 118/62  Weight: 115 lb (52.2 kg)  Height: 5' 5.5 (1.664 m)    GENERAL: Patient is afebrile, Vital signs reviewed, well appearing, Patient appears comfortable, Alert and lucid. No apparent distress.   Physical Exam   Ortho Exam: Bilateral foot exam: On examination of bilateral feet patient has erythema over medial superior aspect of first MTP at site of bunion formation, no warmth.  Likely due to friction and arthritic change, not indicative of infection.  Mild tenderness to palpation over  bunion bilaterally.  Hallux rigidus on exam.  Patient has breakdown of transverse arch bilaterally.  Patient walks with excessive pronation given valgus angulation of toes.  Mild hammering of 2nd and 3rd toes on right foot.  Moderately preserved medial longitudinal arch.  Lateral capsule breakdown of 4th and 5th toes on right foot.   RESULTS:  Labs: No results found for this or any previous visit (from the past 48 hours).  Imaging:  No orders to display    Assessment/Plan:  1. Bilateral foot pain   2. Loss of transverse plantar arch, unspecified laterality   3. Bunion; bilateral    Patient was fitted for a : Fit 'n Run semi-rigid orthotic  The orthotic was heated, placed on the orthotic stand. The patient was positioned in subtalar neutral position and 10 degrees of ankle dorsiflexion in a weight bearing stance on the heated orthotic blank After completion of molding Blank: Fit 'n Run - size 8 Posting:  none added  Base: none added -After fitting of custom orthotics new orthotics were placed in patient's walking shoes of choice.  Patient endorsed increased comfort with standing and on ambulation.  On repeat ambulation assessment patient demonstrates more neutral alignment of subtalar joint and improved walking mechanics.  Evidence of decreased pronation with walking. -Patient will begin to incorporate wearing custom orthotics into daily shoes as much as possible with goal of wearing them close to 100% the time while walking or exercising. - Informed patient that she is welcome to return for orthotic adjustments if she notices any issues with fit or comfort in upcoming weeks/months.  Repeat visit for orthotic adjustment will be free of charge - Patient understands and agrees to treatment plan.  No further questions or concerns at this time.   New Prescriptions   No medications on file    Medications, medical history, allergies, surgical history, hospitalizations, family history,  social history, ROS and vitals entered by nursing staff and reviewed by myself.  I discussed with the patient the diagnosis, treatment plan, indications for return to the emergency department, and for expected follow-up. The patient verbalized an understanding. The patient is asked if there are any questions or concerns. We discuss the case, until all issues are addressed to the patient's satisfaction.  Follow up per instructions including returning for additional office visit if symptoms worsen or proceeding to the emergency department or urgent care in the next 12-24hrs if there is an acute concerning increasing symptoms, pain, fevers, or other symptoms.  Prentice Agent, DO  1:40 PM, 10/10/2024

## 2024-10-23 DIAGNOSIS — K080 Exfoliation of teeth due to systemic causes: Secondary | ICD-10-CM | POA: Diagnosis not present

## 2024-12-04 ENCOUNTER — Encounter: Payer: Self-pay | Admitting: Family Medicine

## 2024-12-06 ENCOUNTER — Ambulatory Visit: Admitting: Family Medicine

## 2024-12-06 ENCOUNTER — Encounter: Payer: Self-pay | Admitting: Family Medicine

## 2024-12-06 ENCOUNTER — Ambulatory Visit (INDEPENDENT_AMBULATORY_CARE_PROVIDER_SITE_OTHER): Admitting: Family Medicine

## 2024-12-06 VITALS — BP 92/60 | HR 95 | Temp 98.3°F | Ht 65.5 in | Wt 112.6 lb

## 2024-12-06 DIAGNOSIS — L658 Other specified nonscarring hair loss: Secondary | ICD-10-CM | POA: Diagnosis not present

## 2024-12-06 MED ORDER — MINOXIDIL 2.5 MG PO TABS: 1.2500 mg | ORAL_TABLET | Freq: Every day | ORAL | 5 refills | Status: AC

## 2024-12-06 NOTE — Progress Notes (Signed)
 "  Established Patient Office Visit  Subjective   Patient ID: Sophia [name], female    DOB: [birth year]  Age: 69 y.o. MRN: [id number]  Chief Complaint  Patient presents with   Hair/Scalp Problem    Patient requests Rx to start oral Minoxidil  due to increased hair loss    HPI Discussed the use of AI scribe software for clinical note transcription with the patient, who gave verbal consent to proceed.  History of Present Illness   Sophia [name] is a 69 year old female with hypothyroidism who presents with concerns about hair loss.  She has had worsening hair thinning over the past six months, with a much thinner ponytail. She denies scalp itchiness or flakiness.  She has hypothyroidism and felt her hair was thicker when she stopped levothyroxine , but she felt unwell and restarted it, after which hair thinning worsened. Her most recent TSH on October 20 was 2.79. She reports her current levothyroxine  dose is 50 mcg.  She is using topical Rogaine  (minoxidil ) without benefit. She takes Flomax at night and Aldactone, and she is worried oral minoxidil  could cause low blood pressure.  She receives Prolia  injections twice yearly for low bone density, with stable numbers on her last scan in November and perceived improvement in her spine. She is concerned that levothyroxine  could worsen bone density but notes her bone density has improved on current treatment.       Current Outpatient Medications  Medication Instructions   aspirin EC 81 mg, Daily   Cholecalciferol (VITAMIN D3 PO) 2,000 Units, Daily   Cyanocobalamin  (VITAMIN B-12 PO) 1,200 mcg, Daily   denosumab  (PROLIA ) 60 MG/ML SOSY injection INJECT 60 MG UNDER THE SKIN ONCE FOR 1 DOSE   levothyroxine  (SYNTHROID ) 25 mcg, Oral, Daily   MAGNESIUM PO 200 mg, Daily   meloxicam  (MOBIC ) 7.5 mg, Oral, Daily   Menaquinone-7 (VITAMIN K2) 100 MCG CAPS 1 capsule, Daily   minoxidil  (LONITEN ) 1.25 mg, Oral, Daily   Polyethylene Glycol 3350  (MIRALAX PO) 1 Capful, Every other day   Probiotic Product (PROBIOTIC DAILY PO) Take by mouth.   psyllium (METAMUCIL) 58.6 % packet 1 packet, Every other day   Zinc Citrate (ZINC GUMMY PO) 2 Pieces, Daily    Patient Active Problem List   Diagnosis Date Noted   Greater trochanteric bursitis of left hip 10/26/2022   Synovitis of hand 10/26/2022   Routine history and physical examination of adult 09/07/2022   Acute sinusitis 08/29/2022   Effusion of joint of left shoulder 06/15/2022   AC joint pain 06/15/2022   Ganglion and cyst of synovium, tendon and bursa 07/03/2021   Loss of transverse plantar arch 07/03/2021   Metatarsalgia of right foot 04/26/2021   Pain in left foot 04/26/2021   Insomnia 03/19/2021   Hypothyroid 08/31/2020   Osteoporosis 03/01/2020   Interstitial cystitis 03/01/2020   Basal cell carcinoma 03/01/2020   Bunion 12/25/2019     Review of Systems  All other systems reviewed and are negative.     Objective:     BP 92/60   Pulse 95   Temp 98.3 F (36.8 C) (Oral)   Ht 5' 5.5 (1.664 m)   Wt 112 lb 9.6 oz (51.1 kg)   SpO2 96%   BMI 18.45 kg/m    Physical Exam Vitals reviewed.  Constitutional:      General: She is not in acute distress.    Appearance: Normal appearance. She is normal weight.  Skin:    Findings:  No erythema, lesion or rash.     Comments: Hair is thinning all over, there are no distinct patches of missing hair, no inflammation or flaking of the scalp  Neurological:     Mental Status: She is alert and oriented to person, place, and time. Mental status is at baseline.  Psychiatric:        Mood and Affect: Mood normal.      No results found for any visits on 12/06/24.    The 10-year ASCVD risk score (Arnett DK, et al., 2019) is: 3%    Assessment & Plan:  Female pattern hair loss -     Minoxidil ; Take 0.5 tablets (1.25 mg total) by mouth daily.  Dispense: 30 tablet; Refill: 5   Assessment and Plan    Female pattern hair  loss Past six months with diffuse thinning and no patchiness or inflammation. No seborrheic dermatitis or other scalp conditions. Possible hypothyroidism contribution with TSH at 2.79, potentially suboptimal for hair density. Minoxidil  is the only clinically proven treatment for reducing hair loss. Discussed potential side effects of oral minoxidil , including hypotension, hypertrichosis, tachycardia, nausea, and pulmonary edema. Consideration of dutasteride if minoxidil  is ineffective. Herbal supplements like pumpkin seed oil and saw palmetto may help by reducing DHT levels. Nizoral shampoo may aid in hair regrowth. - Prescribed oral minoxidil  starting at 1.25 mg, to be taken at night to minimize hypotension risk. - Advised to discuss with endocrinologist about increasing levothyroxine  dosage to 50 mcg for optimal TSH levels. - Recommended trying herbal supplements: saw palmetto and pumpkin seed oil, with caution regarding joint issues. - Suggested using Nizoral shampoo once or twice a week for potential hair regrowth benefits. - Advised to stay hydrated and monitor for increased shedding initially with minoxidil  use.        No follow-ups on file.    Heron CHRISTELLA Sharper, MD "

## 2024-12-06 NOTE — Patient Instructions (Addendum)
 Pumpkin seed oil -- 3000 mg per day -- these will block 5-DHT   Saw palmetto -- at least 500 mg daily  Ask dr. Trixie about increasing levothyroxine   Nizoral shampoo  has been shown to help with hair regrowth

## 2024-12-09 ENCOUNTER — Encounter: Payer: Self-pay | Admitting: Internal Medicine

## 2024-12-10 MED ORDER — LEVOTHYROXINE SODIUM 25 MCG PO TABS: 50.0000 ug | ORAL_TABLET | Freq: Every day | ORAL | 3 refills | Status: AC

## 2024-12-11 ENCOUNTER — Ambulatory Visit: Admitting: Family Medicine

## 2025-01-13 ENCOUNTER — Ambulatory Visit: Payer: BC Managed Care – PPO | Admitting: Internal Medicine

## 2025-01-16 ENCOUNTER — Ambulatory Visit: Admitting: Family Medicine

## 2025-03-07 ENCOUNTER — Ambulatory Visit

## 2025-09-10 ENCOUNTER — Encounter: Admitting: Family Medicine
